# Patient Record
Sex: Female | Born: 1990 | Race: White | Hispanic: No | Marital: Married | Smoking: Never smoker
Health system: Southern US, Community
[De-identification: ages and names within clinical notes are randomized; demographics above are authoritative.]

## PROBLEM LIST (undated history)

## (undated) DIAGNOSIS — Z789 Other specified health status: Secondary | ICD-10-CM

## (undated) HISTORY — PX: OTHER SURGICAL HISTORY: SHX169

## (undated) HISTORY — DX: Other specified health status: Z78.9

---

## 2015-09-13 DIAGNOSIS — Z3009 Encounter for other general counseling and advice on contraception: Secondary | ICD-10-CM | POA: Diagnosis not present

## 2015-11-13 ENCOUNTER — Other Ambulatory Visit (HOSPITAL_COMMUNITY)
Admission: RE | Admit: 2015-11-13 | Discharge: 2015-11-13 | Disposition: A | Discharge status: Home / Self Care | Payer: 59 | Source: Ambulatory Visit | Attending: Family Medicine | Admitting: Family Medicine

## 2015-11-13 ENCOUNTER — Other Ambulatory Visit: Payer: Self-pay | Admitting: Family Medicine

## 2015-11-13 DIAGNOSIS — Z01411 Encounter for gynecological examination (general) (routine) with abnormal findings: Secondary | ICD-10-CM | POA: Diagnosis not present

## 2015-11-13 DIAGNOSIS — Z124 Encounter for screening for malignant neoplasm of cervix: Secondary | ICD-10-CM | POA: Diagnosis not present

## 2015-11-13 DIAGNOSIS — E785 Hyperlipidemia, unspecified: Secondary | ICD-10-CM | POA: Diagnosis not present

## 2015-11-13 DIAGNOSIS — Z0000 Encounter for general adult medical examination without abnormal findings: Secondary | ICD-10-CM | POA: Diagnosis not present

## 2015-11-14 LAB — CYTOLOGY - PAP

## 2016-11-19 DIAGNOSIS — Z0000 Encounter for general adult medical examination without abnormal findings: Secondary | ICD-10-CM | POA: Diagnosis not present

## 2016-11-19 DIAGNOSIS — E785 Hyperlipidemia, unspecified: Secondary | ICD-10-CM | POA: Diagnosis not present

## 2016-11-19 DIAGNOSIS — Z309 Encounter for contraceptive management, unspecified: Secondary | ICD-10-CM | POA: Diagnosis not present

## 2018-09-29 ENCOUNTER — Ambulatory Visit (INDEPENDENT_AMBULATORY_CARE_PROVIDER_SITE_OTHER): Payer: No Typology Code available for payment source | Admitting: Advanced Practice Midwife

## 2018-09-29 ENCOUNTER — Encounter: Payer: Self-pay | Admitting: Advanced Practice Midwife

## 2018-09-29 ENCOUNTER — Other Ambulatory Visit: Payer: Self-pay

## 2018-09-29 VITALS — BP 116/68 | Ht 66.0 in | Wt 129.0 lb

## 2018-09-29 DIAGNOSIS — Z3401 Encounter for supervision of normal first pregnancy, first trimester: Secondary | ICD-10-CM

## 2018-09-29 DIAGNOSIS — Z3A01 Less than 8 weeks gestation of pregnancy: Secondary | ICD-10-CM | POA: Diagnosis not present

## 2018-09-29 DIAGNOSIS — O3680X0 Pregnancy with inconclusive fetal viability, not applicable or unspecified: Secondary | ICD-10-CM | POA: Diagnosis not present

## 2018-09-29 MED ORDER — PRENATAL VITAMIN 27-0.8 MG PO TABS: 1.0000 | ORAL_TABLET | Freq: Every day | ORAL | 12 refills | Status: DC

## 2018-09-29 NOTE — Progress Notes (Signed)
  Subjective:    Caroline Mack is a G1P0 [redacted]w[redacted]d being seen today for her first obstetrical visit.  Her obstetrical history is significant for primigravida. Patient does intend to breast feed. Pregnancy history fully reviewed.  Patient reports no complaints.  Vitals:   09/29/18 0900 09/29/18 0900  BP: 116/68   Weight: 58.5 kg   Height:  5\' 6"  (1.676 m)    HISTORY: OB History  Gravida Para Term Preterm AB Living  1            SAB TAB Ectopic Multiple Live Births               # Outcome Date GA Lbr Len/2nd Weight Sex Delivery Anes PTL Lv  1 Current            Past Medical History:  Diagnosis Date  . Medical history non-contributory    Past Surgical History:  Procedure Laterality Date  . ear tumor Left    Family History  Problem Relation Age of Onset  . Diabetes Maternal Grandfather   . Cancer Paternal Grandfather   . Hypertension Neg Hx      Exam    Uterus:     Pelvic Exam:    Perineum: No Hemorrhoids   Vulva: Bartholin's, Urethra, Skene's normal   Vagina:  normal mucosa, normal discharge   pH:    Cervix: Bleeding with pap   Adnexa: normal adnexa and no mass, fullness, tenderness   Bony Pelvis: gynecoid  System: Breast:  normal appearance, no masses or tenderness   Skin: normal coloration and turgor, no rashes    Neurologic: oriented, grossly non-focal   Extremities: normal strength, tone, and muscle mass   HEENT neck supple with midline trachea   Mouth/Teeth mucous membranes moist, pharynx normal without lesions   Neck supple   Cardiovascular: regular rate and rhythm   Respiratory:  appears well, vitals normal, no respiratory distress, acyanotic, normal RR, ear and throat exam is normal, neck free of mass or lymphadenopathy   Lungs CTAB   Abdomen: soft, non-tender; bowel sounds normal; no masses,  no organomegaly   Urinary: urethral meatus normal      Assessment:    Pregnancy: G1P0 Patient Active Problem List   Diagnosis Date Noted  . Encounter for  supervision of normal first pregnancy in first trimester 09/29/2018    Single intrauterine pregnancy at [redacted]w[redacted]d     Plan:  Routines reviewed Welcomed to practice. Reviewed providers, how we work and MAU availability  Initial labs drawn. Prenatal vitamins. Problem list reviewed and updated. Genetic Screening discussed - Panorama with AFP  requested.  Ultrasound discussed; fetal survey: requested.  Follow up in 4 weeks. 50% of 30 min visit spent on counseling and coordination of care.     Hansel Feinstein 09/29/2018

## 2018-09-29 NOTE — Progress Notes (Signed)
DATING AND VIABILITY SONOGRAM   Caroline Mack is a 28 y.o. year old G1P0 with LMP Patient's last menstrual period was 08/10/2018. which would correlate to  [redacted]w[redacted]d weeks gestation.  She has irregular menstrual cycles.   She is here today for a confirmatory initial sonogram.    GESTATION: SINGLETON     FETAL ACTIVITY:          Heart rate        130          The fetus is active.     GESTATIONAL AGE AND  BIOMETRICS:  Gestational criteria: Estimated Date of Delivery: 05/17/19 by LMP now at [redacted]w[redacted]d  Previous Scans:0  GESTATIONAL SAC           2.02 cm        6 - 6 weeks  CROWN RUMP LENGTH           0.901 cm        6-6 weeks                                                                               AVERAGE EGA(BY THIS SCAN):  6-6 weeks  WORKING EDD( LMP ):  05-16-2018     TECHNICIAN COMMENTS:Patient informed that the ultrasound is considered a limited obstetric ultrasound and is not intended to be a complete ultrasound exam. Patient also informed that the ultrasound is not being completed with the intent of assessing for fetal or placental anomalies or any pelvic abnormalities. Explained that the purpose of today's ultrasound is to assess for fetal heart rate. Patient acknowledges the purpose of the exam and the limitations of the study.     Kathrene Alu 09/29/2018 9:21 AM

## 2018-09-29 NOTE — Patient Instructions (Signed)
Genetic Testing During Pregnancy Genetic testing during pregnancy is also called prenatal genetic testing. This type of testing can determine if your baby is at risk of being born with a disorder caused by abnormal genes or chromosomes (genetic disorder). Chromosomes contain genes that control how your baby will develop in your womb. There are many different genetic disorders. Examples of genetic disorders that may be found through genetic testing include Down syndrome and cystic fibrosis. Gene changes (mutations) can be passed down through families. Genetic testing is offered to all women before or during pregnancy. You can choose whether to have genetic testing. Why is genetic testing done? Genetic testing is done during pregnancy to find out whether your child is at risk for a genetic disorder. Having genetic testing allows you to:  Discuss your test results and options with a genetic counselor.  Prepare for a baby that may be born with a genetic disorder. Learning about the disorder ahead of time helps you be better prepared to manage it. Your health care providers can also be prepared in case your baby requires special care before or after birth.  Consider whether you want to continue with the pregnancy. In some cases, genetic testing may be done to learn about the traits a child will inherit. Types of genetic tests There are two basic types of genetic testing. Screening tests indicate whether your developing baby (fetus) is at higher risk for a genetic disorder. Diagnostic tests check actual fetal cells to diagnose a genetic disorder. Screening tests     Screening tests will not harm your baby. They are recommended for all pregnant women. Types of screening tests include:  Carrier screening. This test involves checking genes from both parents by testing their blood or saliva. The test checks to find out if the parents carry a genetic mutation that may be passed to a baby. In most cases,  both parents must carry the mutation for a baby to be at risk.  First trimester screening. This test combines a blood test with sound wave imaging of your baby (fetal ultrasound). This screening test checks for a risk of Down syndrome or other defects caused by having extra chromosomes. It also checks for defects of the heart, abdomen, or skeleton.  Second trimester screening also combines a blood test with a fetal ultrasound exam. It checks for a risk of genetic defects of the face, brain, spine, heart, or limbs.  Combined or sequential screening. This type of testing combines the results of first and second trimester screening. This type of testing may be more accurate than first or second trimester screening alone.  Cell-free DNA testing. This is a blood test that detects cells released by the placenta that get into the mother's blood. It can be used to check for a risk of Down syndrome, other extra chromosome syndromes, and disorders caused by abnormal numbers of sex chromosomes. This test can be done any time after 10 weeks of pregnancy.  Diagnostic tests Diagnostic tests carry slight risks of problems, including bleeding, infection, and loss of the pregnancy. These tests are done only if your baby is at risk for a genetic disorder. You may meet with a genetic counselor to discuss the risks and benefits before having diagnostic tests. Examples of diagnostic tests include:  Chorionic villus sampling (CVS). This involves a procedure to remove and test a sample of cells taken from the placenta. The procedure may be done between 10 and 12 weeks of pregnancy.  Amniocentesis. This involves a  procedure to remove and test a sample of fluid (amniotic fluid) and cells from the sac that surrounds the developing baby. The procedure may be done between 15 and 20 weeks of pregnancy. What do the results mean? For a screening test:  If the results are negative, it often means that your child is not at higher  risk. There is still a slight chance your child could have a genetic disorder.  If the results are positive, it does not mean your child will have a genetic disorder. It may mean that your child has a higher-than-normal risk for a genetic disorder. In that case, you may want to talk with a genetic counselor about whether you should have diagnostic genetic tests. For a diagnostic test:  If the result is negative, it is unlikely that your child will have a genetic disorder.  If the test is positive for a genetic disorder, it is likely that your child will have the disorder. The test may not tell how severe the disorder will be. Talk with your health care provider about your options. Questions to ask your health care provider Before talking to your health care provider about genetic testing, find out if there is a history of genetic disorders in your family. It may also help to know your family's ethnic origins. Then ask your health care provider the following questions:  Is my baby at risk for a genetic disorder?  What are the benefits of having genetic screening?  What tests are best for me and my baby?  What are the risks of each test?  If I get a positive result on a screening test, what is the next step?  Should I meet with a genetic counselor before having a diagnostic test?  Should my partner or other members of my family be tested?  How much do the tests cost? Will my insurance cover the testing? Summary  Genetic testing is done during pregnancy to find out whether your child is at risk for a genetic disorder.  Genetic testing is offered to all women before or during pregnancy. You can choose whether to have genetic testing.  There are two basic types of genetic testing. Screening tests indicate whether your developing baby (fetus) is at higher risk for a genetic disorder. Diagnostic tests check actual fetal cells to diagnose a genetic disorder.  If a diagnostic genetic test is  positive, talk with your health care provider about your options. This information is not intended to replace advice given to you by your health care provider. Make sure you discuss any questions you have with your health care provider. Document Released: 06/23/2017 Document Revised: 06/23/2017 Document Reviewed: 06/23/2017 Elsevier Interactive Patient Education  2019 [employer] First Trimester of Pregnancy The first trimester of pregnancy is from week 1 until the end of week 13 (months 1 through 3). A week after a sperm fertilizes an egg, the egg will implant on the wall of the uterus. This embryo will begin to develop into a baby. Genes from you and your partner will form the baby. The female genes will determine whether the baby will be a boy or a girl. At 6-8 weeks, the eyes and face will be formed, and the heartbeat can be seen on ultrasound. At the end of 12 weeks, all the baby's organs will be formed. Now that you are pregnant, you will want to do everything you can to have a healthy baby. Two of the most important things are to get good  prenatal care and to follow your health care provider's instructions. Prenatal care is all the medical care you receive before the baby's birth. This care will help prevent, find, and treat any problems during the pregnancy and childbirth. Body changes during your first trimester Your body goes through many changes during pregnancy. The changes vary from woman to woman.  You may gain or lose a couple of pounds at first.  You may feel sick to your stomach (nauseous) and you may throw up (vomit). If the vomiting is uncontrollable, call your health care provider.  You may tire easily.  You may develop headaches that can be relieved by medicines. All medicines should be approved by your health care provider.  You may urinate more often. Painful urination may mean you have a bladder infection.  You may develop heartburn as a result of your pregnancy.  You  may develop constipation because certain hormones are causing the muscles that push stool through your intestines to slow down.  You may develop hemorrhoids or swollen veins (varicose veins).  Your breasts may begin to grow larger and become tender. Your nipples may stick out more, and the tissue that surrounds them (areola) may become darker.  Your gums may bleed and may be sensitive to brushing and flossing.  Dark spots or blotches (chloasma, mask of pregnancy) may develop on your face. This will likely fade after the baby is born.  Your menstrual periods will stop.  You may have a loss of appetite.  You may develop cravings for certain kinds of food.  You may have changes in your emotions from day to day, such as being excited to be pregnant or being concerned that something may go wrong with the pregnancy and baby.  You may have more vivid and strange dreams.  You may have changes in your hair. These can include thickening of your hair, rapid growth, and changes in texture. Some women also have hair loss during or after pregnancy, or hair that feels dry or thin. Your hair will most likely return to normal after your baby is born. What to expect at prenatal visits During a routine prenatal visit:  You will be weighed to make sure you and the baby are growing normally.  Your blood pressure will be taken.  Your abdomen will be measured to track your baby's growth.  The fetal heartbeat will be listened to between weeks 10 and 14 of your pregnancy.  Test results from any previous visits will be discussed. Your health care provider may ask you:  How you are feeling.  If you are feeling the baby move.  If you have had any abnormal symptoms, such as leaking fluid, bleeding, severe headaches, or abdominal cramping.  If you are using any tobacco products, including cigarettes, chewing tobacco, and electronic cigarettes.  If you have any questions. Other tests that may be  performed during your first trimester include:  Blood tests to find your blood type and to check for the presence of any previous infections. The tests will also be used to check for low iron levels (anemia) and protein on red blood cells (Rh antibodies). Depending on your risk factors, or if you previously had diabetes during pregnancy, you may have tests to check for high blood sugar that affects pregnant women (gestational diabetes).  Urine tests to check for infections, diabetes, or protein in the urine.  An ultrasound to confirm the proper growth and development of the baby.  Fetal screens for spinal cord  presence of any previous infections. The tests will also be used to check for low iron levels (anemia) and protein on red blood cells (Rh antibodies). Depending on your risk factors, or if you previously had diabetes during pregnancy, you may have tests to check for high blood sugar that affects pregnant women (gestational diabetes).   Urine tests to check for infections, diabetes, or protein in the urine.   An ultrasound to confirm the proper growth and development of the baby.   Fetal screens for spinal cord problems (spina bifida) and Down syndrome.   HIV (human immunodeficiency virus) testing. Routine prenatal testing includes screening for HIV, unless you choose not to have this test.   You may need other tests to make sure you and the baby are doing well.  Follow these instructions at home:  Medicines   Follow your health care provider's instructions regarding medicine use. Specific medicines may be either safe or unsafe to take during pregnancy.   Take a prenatal vitamin that contains at least 600 micrograms (mcg) of folic acid.   If you develop constipation, try taking a stool softener if your health care provider approves.  Eating and drinking     Eat a balanced diet that includes fresh fruits and vegetables, whole grains, good sources of protein such as meat, eggs, or tofu, and low-fat dairy. Your health care provider will help you determine the amount of weight gain that is right for you.   Avoid raw meat and uncooked cheese. These carry germs that can cause birth defects in the baby.   Eating four or five small meals rather than three large meals a day may help relieve nausea and vomiting. If you start to feel nauseous, eating a few soda crackers can be helpful. Drinking liquids between meals, instead of during meals, also seems to help ease nausea and vomiting.   Limit foods that are  high in fat and processed sugars, such as fried and sweet foods.   To prevent constipation:  ? Eat foods that are high in fiber, such as fresh fruits and vegetables, whole grains, and beans.  ? Drink enough fluid to keep your urine clear or pale yellow.  Activity   Exercise only as directed by your health care provider. Most women can continue their usual exercise routine during pregnancy. Try to exercise for 30 minutes at least 5 days a week. Exercising will help you:  ? Control your weight.  ? Stay in shape.  ? Be prepared for labor and delivery.   Experiencing pain or cramping in the lower abdomen or lower back is a good sign that you should stop exercising. Check with your health care provider before continuing with normal exercises.   Try to avoid standing for long periods of time. Move your legs often if you must stand in one place for a long time.   Avoid heavy lifting.   Wear low-heeled shoes and practice good posture.   You may continue to have sex unless your health care provider tells you not to.  Relieving pain and discomfort   Wear a good support bra to relieve breast tenderness.   Take warm sitz baths to soothe any pain or discomfort caused by hemorrhoids. Use hemorrhoid cream if your health care provider approves.   Rest with your legs elevated if you have leg cramps or low back pain.   If you develop varicose veins in your legs, wear support hose. Elevate your feet for 15 minutes, 3-4 times   month 6 of your pregnancy.  Ask for help if you have counseling or nutritional needs during pregnancy. Your health care provider can offer advice or refer you to specialists for help with various needs.  Do not use hot tubs, steam rooms, or saunas.  Do not douche or use tampons or scented sanitary pads.  Do not cross your legs for long periods of time.  Avoid cat litter boxes and soil used by cats. These carry germs that can cause birth defects in the baby and possibly loss of the fetus by miscarriage or stillbirth.  Avoid all smoking, herbs, alcohol, and medicines not prescribed by your health care provider. Chemicals in these products affect the formation and growth of the baby.  Do not use any products that contain nicotine or tobacco, such as cigarettes and e-cigarettes. If you need help quitting, ask your health care provider. You may receive counseling support and other resources to help you quit.  Schedule a dentist appointment. At home, brush your teeth with a soft toothbrush and be gentle when you floss. Contact a health care provider if:  You have dizziness.  You have mild pelvic cramps, pelvic pressure, or nagging pain in the abdominal area.  You have persistent nausea, vomiting, or diarrhea.  You have a bad smelling vaginal discharge.  You have pain when you urinate.  You notice increased swelling in your face, hands, legs, or ankles.  You are exposed to fifth disease or chickenpox.  You are exposed to [place] measles (rubella) and have never had it. Get help right away if:  You have a fever.  You are leaking fluid from your vagina.  You have spotting or bleeding from your vagina.  You have severe abdominal cramping or pain.  You have rapid weight gain or loss.   You vomit blood or material that looks like coffee grounds.  You develop a severe headache.  You have shortness of breath.  You have any kind of trauma, such as from a fall or a car accident. Summary  The first trimester of pregnancy is from week 1 until the end of week 13 (months 1 through 3).  Your body goes through many changes during pregnancy. The changes vary from woman to woman.  You will have routine prenatal visits. During those visits, your health care provider will examine you, discuss any test results you may have, and talk with you about how you are feeling. This information is not intended to replace advice given to you by your health care provider. Make sure you discuss any questions you have with your health care provider. Document Released: 04/02/2001 Document Revised: 03/20/2016 Document Reviewed: 03/20/2016 Elsevier Interactive Patient Education  2019 [employer].

## 2018-09-30 LAB — OBSTETRIC PANEL, INCLUDING HIV
Antibody Screen: NEGATIVE
Basophils Absolute: 0 10*3/uL (ref 0.0–0.2)
Basos: 0 %
EOS (ABSOLUTE): 0 10*3/uL (ref 0.0–0.4)
Eos: 1 %
HIV Screen 4th Generation wRfx: NONREACTIVE
Hematocrit: 36.3 % (ref 34.0–46.6)
Hemoglobin: 12.2 g/dL (ref 11.1–15.9)
Hepatitis B Surface Ag: NEGATIVE
Immature Grans (Abs): 0 10*3/uL (ref 0.0–0.1)
Immature Granulocytes: 0 %
Lymphocytes Absolute: 1.8 10*3/uL (ref 0.7–3.1)
Lymphs: 23 %
MCH: 30.3 pg (ref 26.6–33.0)
MCHC: 33.6 g/dL (ref 31.5–35.7)
MCV: 90 fL (ref 79–97)
Monocytes Absolute: 0.8 10*3/uL (ref 0.1–0.9)
Monocytes: 10 %
Neutrophils Absolute: 5.2 10*3/uL (ref 1.4–7.0)
Neutrophils: 66 %
Platelets: 214 10*3/uL (ref 150–450)
RBC: 4.03 x10E6/uL (ref 3.77–5.28)
RDW: 13.4 % (ref 11.7–15.4)
RPR Ser Ql: NONREACTIVE
Rh Factor: POSITIVE
Rubella Antibodies, IGG: 1.82 index (ref >0.99)
WBC: 7.8 10*3/uL (ref 3.4–10.8)

## 2018-10-01 LAB — CYTOLOGY - PAP
Chlamydia: NEGATIVE
Diagnosis: NEGATIVE
Neisseria Gonorrhea: NEGATIVE

## 2018-10-01 LAB — URINE CULTURE: Organism ID, Bacteria: NO GROWTH

## 2018-10-02 ENCOUNTER — Encounter: Payer: Self-pay | Admitting: Advanced Practice Midwife

## 2018-10-02 DIAGNOSIS — Z01419 Encounter for gynecological examination (general) (routine) without abnormal findings: Secondary | ICD-10-CM | POA: Insufficient documentation

## 2018-10-27 ENCOUNTER — Encounter: Payer: Self-pay | Admitting: Advanced Practice Midwife

## 2018-10-27 ENCOUNTER — Other Ambulatory Visit: Payer: Self-pay

## 2018-10-27 ENCOUNTER — Ambulatory Visit (INDEPENDENT_AMBULATORY_CARE_PROVIDER_SITE_OTHER): Payer: No Typology Code available for payment source | Admitting: Advanced Practice Midwife

## 2018-10-27 VITALS — BP 116/69 | Wt 128.0 lb

## 2018-10-27 DIAGNOSIS — Z3401 Encounter for supervision of normal first pregnancy, first trimester: Secondary | ICD-10-CM

## 2018-10-27 DIAGNOSIS — Z3400 Encounter for supervision of normal first pregnancy, unspecified trimester: Secondary | ICD-10-CM

## 2018-10-27 DIAGNOSIS — Z3A11 11 weeks gestation of pregnancy: Secondary | ICD-10-CM

## 2018-10-27 MED ORDER — AMBULATORY NON FORMULARY MEDICATION: 1.0000 | 0 refills | Status: DC

## 2018-10-27 NOTE — Progress Notes (Deleted)
  Subjective:    Caroline Mack is being seen today for her first obstetrical visit.  This {is/is not:9024} a planned pregnancy. She is at [redacted]w[redacted]d gestation. Her obstetrical history is significant for {ob risk factors:10154}. Relationship with FOB: {fob:16621}. Patient {does/does not:19097} intend to breast feed. Pregnancy history fully reviewed.  Patient reports {sx:14538}.  Review of Systems:   Review of Systems  Objective:     BP 116/69   Wt 58.1 kg   LMP 08/10/2018   BMI 20.66 kg/m  Physical Exam  Exam    Assessment:    Pregnancy: G1P0 Patient Active Problem List   Diagnosis Date Noted  . Pap smear, low-risk 10/02/2018  . Encounter for supervision of normal first pregnancy in first trimester 09/29/2018       Plan:     Initial labs drawn. Prenatal vitamins. Problem list reviewed and updated. AFP3 discussed: {requests/ordered/declines:14581}. Role of ultrasound in pregnancy discussed; fetal survey: {requests/ordered/declines:14581}. Amniocentesis discussed: {amniocentesis:14582}. Follow up in {numbers 0-4:31231} weeks. ***% of *** min visit spent on counseling and coordination of care.  ***   Jong Rickman 10/27/2018   

## 2018-10-27 NOTE — Progress Notes (Deleted)
  Subjective:    Caroline Mack is being seen today for her first obstetrical visit.  This {is/is not:9024} a planned pregnancy. She is at [redacted]w[redacted]d gestation. Her obstetrical history is significant for {ob risk factors:10154}. Relationship with FOB: {fob:16621}. Patient {does/does not:19097} intend to breast feed. Pregnancy history fully reviewed.  Patient reports {sx:14538}.  Review of Systems:   Review of Systems  Objective:     BP 116/69   Wt 58.1 kg   LMP 08/10/2018   BMI 20.66 kg/m  Physical Exam  Exam    Assessment:    Pregnancy: G1P0 Patient Active Problem List   Diagnosis Date Noted  . Pap smear, low-risk 10/02/2018  . Encounter for supervision of normal first pregnancy in first trimester 09/29/2018       Plan:     Initial labs drawn. Prenatal vitamins. Problem list reviewed and updated. AFP3 discussed: {requests/ordered/declines:14581}. Role of ultrasound in pregnancy discussed; fetal survey: {requests/ordered/declines:14581}. Amniocentesis discussed: {amniocentesis:14582}. Follow up in {numbers 0-4:31231} weeks. ***% of *** min visit spent on counseling and coordination of care.  ***   Hansel Feinstein 10/27/2018

## 2018-10-27 NOTE — Progress Notes (Signed)
   PRENATAL VISIT NOTE  Subjective:  Caroline Mack is a 28 y.o. G1P0 at 28w1dbeing seen today for ongoing prenatal care.  She is currently monitored for the following issues for this low-risk pregnancy and has Encounter for supervision of normal first pregnancy in first trimester and Pap smear, low-risk on their problem list.  Patient reports no complaints.   .  .  Movement: Absent. Denies leaking of fluid.   The following portions of the patient's history were reviewed and updated as appropriate: allergies, current medications, past family history, past medical history, past social history, past surgical history and problem list.   Objective:   Vitals:   10/27/18 0852  BP: 116/69  Weight: 58.1 kg    Fetal Status: Fetal Heart Rate (bpm): 165 Fundal Height: 11 cm Movement: Absent     General:  Alert, oriented and cooperative. Patient is in no acute distress.  Skin: Skin is warm and dry. No rash noted.   Cardiovascular: Normal heart rate noted  Respiratory: Normal respiratory effort, no problems with respiration noted  Abdomen: Soft, gravid, appropriate for gestational age.  Pain/Pressure: Absent     Pelvic: Cervical exam deferred        Extremities: Normal range of motion.  Edema: None  Mental Status: Normal mood and affect. Normal behavior. Normal judgment and thought content.   Assessment and Plan:  Pregnancy: G1P0 at 130w1d. Supervision of normal first pregnancy, antepartum      Reviewed signs of problems to report and how to go to MAU      Reviewed fetal development      Declines genetic testiing for now due to cost (states insurance won't pay) I suggested she call them directly and ask as most do pay)  - AMBULATORY NON FORMULARY MEDICATION; 1 Device by Other route once a week. Blood pressure Cuff/Medium  Monitored Regularly at home ICD 10: Z34.90  Dispense: 1 kit; Refill: 0  Preterm labor symptoms and general obstetric precautions including but not limited to vaginal  bleeding, contractions, leaking of fluid and fetal movement were reviewed in detail with the patient. Please refer to After Visit Summary for other counseling recommendations.   Return in about 4 weeks (around 11/24/2018) for [employer] Future Appointments  Date Time Provider [place]8/10/2018  8:45 AM HaLavonia DraftsMD [facility] None  12/22/2018  8:00 AM WHGodleySKorea [facility]-MFCUS MFC-US    MaHansel FeinsteinCNM

## 2018-10-27 NOTE — Patient Instructions (Signed)

## 2018-11-06 ENCOUNTER — Encounter: Payer: 59 | Admitting: Obstetrics & Gynecology

## 2018-11-06 ENCOUNTER — Encounter: Payer: Self-pay | Admitting: Obstetrics & Gynecology

## 2018-11-27 ENCOUNTER — Ambulatory Visit (INDEPENDENT_AMBULATORY_CARE_PROVIDER_SITE_OTHER): Payer: No Typology Code available for payment source | Admitting: Obstetrics & Gynecology

## 2018-11-27 ENCOUNTER — Other Ambulatory Visit: Payer: Self-pay

## 2018-11-27 ENCOUNTER — Encounter: Payer: Self-pay | Admitting: Obstetrics & Gynecology

## 2018-11-27 VITALS — BP 113/72 | HR 93 | Wt 129.0 lb

## 2018-11-27 DIAGNOSIS — Z3A15 15 weeks gestation of pregnancy: Secondary | ICD-10-CM

## 2018-11-27 DIAGNOSIS — Z3401 Encounter for supervision of normal first pregnancy, first trimester: Secondary | ICD-10-CM

## 2018-11-27 DIAGNOSIS — Z3400 Encounter for supervision of normal first pregnancy, unspecified trimester: Secondary | ICD-10-CM

## 2018-11-27 NOTE — Patient Instructions (Addendum)

## 2018-11-27 NOTE — Progress Notes (Signed)
   PRENATAL VISIT NOTE  Subjective:  Caroline Mack is a 28 y.o. G1P0 at [redacted]w[redacted]d being seen today for ongoing prenatal care.  She is currently monitored for the following issues for this low-risk pregnancy and has Encounter for supervision of normal first pregnancy in first trimester and Pap smear, low-risk on their problem list.  Patient reports no complaints.  Contractions: Not present. Vag. Bleeding: None.   . Denies leaking of fluid.   The following portions of the patient's history were reviewed and updated as appropriate: allergies, current medications, past family history, past medical history, past social history, past surgical history and problem list.   Objective:   Vitals:   11/27/18 0900  BP: 113/72  Pulse: 93  Weight: 129 lb (58.5 kg)    Fetal Status: Fetal Heart Rate (bpm): 155         General:  Alert, oriented and cooperative. Patient is in no acute distress.  Skin: Skin is warm and dry. No rash noted.   Cardiovascular: Normal heart rate noted  Respiratory: Normal respiratory effort, no problems with respiration noted  Abdomen: Soft, gravid, appropriate for gestational age.  Pain/Pressure: Absent     Pelvic: Cervical exam deferred        Extremities: Normal range of motion.  Edema: None  Mental Status: Normal mood and affect. Normal behavior. Normal judgment and thought content.   Assessment and Plan:  Pregnancy: G1P0 at [redacted]w[redacted]d 1. Supervision of normal first pregnancy, antepartum Pt wants to call the company to see if the NIPS is covered by her insurance.   2. Encounter for supervision of normal first pregnancy in first trimester Has [place] scheduled.   Preterm labor symptoms and general obstetric precautions including but not limited to vaginal bleeding, contractions, leaking of fluid and fetal movement were reviewed in detail with the patient. Please refer to After Visit Summary for other counseling recommendations.   Return in about 5 weeks (around 01/01/2019).   Future Appointments  Date Time Provider [place]  12/22/2018  8:00 AM [facility]-MFC [place] 3 [facility]-M[facility]US M[facility]-US    Lavonia Drafts, MD

## 2018-12-22 ENCOUNTER — Other Ambulatory Visit: Payer: Self-pay

## 2018-12-22 ENCOUNTER — Ambulatory Visit (HOSPITAL_COMMUNITY)
Admission: RE | Admit: 2018-12-22 | Discharge: 2018-12-22 | Disposition: A | Discharge status: Home / Self Care | Payer: No Typology Code available for payment source | Source: Ambulatory Visit | Attending: Obstetrics and Gynecology | Admitting: Obstetrics and Gynecology

## 2018-12-22 DIAGNOSIS — Z363 Encounter for antenatal screening for malformations: Secondary | ICD-10-CM

## 2018-12-22 DIAGNOSIS — Z3401 Encounter for supervision of normal first pregnancy, first trimester: Secondary | ICD-10-CM | POA: Insufficient documentation

## 2019-01-01 ENCOUNTER — Ambulatory Visit (INDEPENDENT_AMBULATORY_CARE_PROVIDER_SITE_OTHER): Payer: No Typology Code available for payment source | Admitting: Family Medicine

## 2019-01-01 ENCOUNTER — Other Ambulatory Visit: Payer: Self-pay

## 2019-01-01 VITALS — BP 117/85 | HR 90 | Wt 135.0 lb

## 2019-01-01 DIAGNOSIS — Z3400 Encounter for supervision of normal first pregnancy, unspecified trimester: Secondary | ICD-10-CM

## 2019-01-01 DIAGNOSIS — Z3A20 20 weeks gestation of pregnancy: Secondary | ICD-10-CM

## 2019-01-01 DIAGNOSIS — Z3401 Encounter for supervision of normal first pregnancy, first trimester: Secondary | ICD-10-CM

## 2019-01-01 NOTE — Progress Notes (Signed)
   PRENATAL VISIT NOTE  Subjective:  Caroline Mack is a 28 y.o. G1P0 at [redacted]w[redacted]d being seen today for ongoing prenatal care.  She is currently monitored for the following issues for this low-risk pregnancy and has Encounter for supervision of normal first pregnancy in first trimester and Pap smear, low-risk on their problem list.  Patient reports no complaints.  Contractions: Not present. Vag. Bleeding: None.  Movement: Present. Denies leaking of fluid.   The following portions of the patient's history were reviewed and updated as appropriate: allergies, current medications, past family history, past medical history, past social history, past surgical history and problem list.   Objective:   Vitals:   01/01/19 0906  BP: 117/85  Pulse: 90  Weight: 135 lb (61.2 kg)    Fetal Status: Fetal Heart Rate (bpm): 154 Fundal Height: 20 cm Movement: Present     General:  Alert, oriented and cooperative. Patient is in no acute distress.  Skin: Skin is warm and dry. No rash noted.   Cardiovascular: Normal heart rate noted  Respiratory: Normal respiratory effort, no problems with respiration noted  Abdomen: Soft, gravid, appropriate for gestational age.  Pain/Pressure: Absent     Pelvic: Cervical exam deferred        Extremities: Normal range of motion.  Edema: None  Mental Status: Normal mood and affect. Normal behavior. Normal judgment and thought content.   Assessment and Plan:  Pregnancy: G1P0 at [redacted]w[redacted]d 1. Supervision of normal first pregnancy, antepartum FHT and FH normal. [place] - no concerns.   Preterm labor symptoms and general obstetric precautions including but not limited to vaginal bleeding, contractions, leaking of fluid and fetal movement were reviewed in detail with the patient. Please refer to After Visit Summary for other counseling recommendations.   Return in about 4 weeks (around 01/29/2019) for OB f/u, Virtual.  No future appointments.  Truett Mainland, DO

## 2019-02-02 ENCOUNTER — Ambulatory Visit (INDEPENDENT_AMBULATORY_CARE_PROVIDER_SITE_OTHER): Payer: No Typology Code available for payment source | Admitting: Advanced Practice Midwife

## 2019-02-02 ENCOUNTER — Encounter: Payer: Self-pay | Admitting: Advanced Practice Midwife

## 2019-02-02 VITALS — BP 112/70 | HR 80 | Wt 138.5 lb

## 2019-02-02 DIAGNOSIS — Z3400 Encounter for supervision of normal first pregnancy, unspecified trimester: Secondary | ICD-10-CM

## 2019-02-02 DIAGNOSIS — Z3A25 25 weeks gestation of pregnancy: Secondary | ICD-10-CM

## 2019-02-02 DIAGNOSIS — Z3402 Encounter for supervision of normal first pregnancy, second trimester: Secondary | ICD-10-CM

## 2019-02-02 NOTE — Progress Notes (Signed)
I connected on 02/02/19 at  9:00 AM EDT by: webex and verified that I am speaking with the correct person using two identifiers.  Patient is located at home and provider is located at [facility] office.     The purpose of this virtual visit is to provide medical care while limiting exposure to the novel coronavirus. I discussed the limitations, risks, security and privacy concerns of performing an evaluation and management service by myself and the availability of in person appointments. I also discussed with the patient that there may be a patient responsible charge related to this service. By engaging in this virtual visit, you consent to the provision of healthcare.  Additionally, you authorize for your insurance to be billed for the services provided during this visit.  The patient expressed understanding and agreed to proceed.  The following staff members participated in the virtual visit:  Wendelyn Breslow    PRENATAL VISIT NOTE  Subjective:  Caroline Mack is a 28 y.o. G1P0 at [redacted]w[redacted]d  for phone visit for ongoing prenatal care.  She is currently monitored for the following issues for this low-risk pregnancy and has Encounter for supervision of normal first pregnancy in first trimester and Pap smear, low-risk on their problem list.  Patient reports no complaints.  Contractions: Not present. Vag. Bleeding: None.  Movement: Present. Denies leaking of fluid.   The following portions of the patient's history were reviewed and updated as appropriate: allergies, current medications, past family history, past medical history, past social history, past surgical history and problem list.   Objective:   Vitals:   02/02/19 0921  BP: 112/70  Pulse: 80  Weight: 62.8 kg   Self-Obtained  Fetal Status:     Movement: Present     Assessment and Plan:  Pregnancy: G1P0 at [redacted]w[redacted]d Doing well BPs within normal limits. Takes them at work [employer] moving well Got flu shot in September Discussed TDAP Anatomy US WNL,  having a girl Plan Glucola 2-3 weeks Encouraged to go to MAU if she has a fall, pain, fever, or other concerning symptoms.    Preterm labor symptoms and general obstetric precautions including but not limited to vaginal bleeding, contractions, leaking of fluid and fetal movement were reviewed in detail with the patient.  No follow-ups on file.  Future Appointments  Date Time Provider [place]  02/26/2019  8:15 AM Truett Mainland, DO [facility] None     Time spent on virtual visit: 9 minutes  Hansel Feinstein, CNM

## 2019-02-02 NOTE — Patient Instructions (Signed)

## 2019-02-02 NOTE — Progress Notes (Signed)
error 

## 2019-02-26 ENCOUNTER — Encounter: Payer: Self-pay | Admitting: Family Medicine

## 2019-02-26 ENCOUNTER — Ambulatory Visit (INDEPENDENT_AMBULATORY_CARE_PROVIDER_SITE_OTHER): Payer: No Typology Code available for payment source | Admitting: Family Medicine

## 2019-02-26 ENCOUNTER — Other Ambulatory Visit: Payer: Self-pay

## 2019-02-26 VITALS — BP 122/76 | HR 75 | Wt 142.0 lb

## 2019-02-26 DIAGNOSIS — Z23 Encounter for immunization: Secondary | ICD-10-CM | POA: Diagnosis not present

## 2019-02-26 DIAGNOSIS — Z3A28 28 weeks gestation of pregnancy: Secondary | ICD-10-CM

## 2019-02-26 DIAGNOSIS — Z3400 Encounter for supervision of normal first pregnancy, unspecified trimester: Secondary | ICD-10-CM

## 2019-02-26 DIAGNOSIS — Z3403 Encounter for supervision of normal first pregnancy, third trimester: Secondary | ICD-10-CM

## 2019-02-26 DIAGNOSIS — Z3401 Encounter for supervision of normal first pregnancy, first trimester: Secondary | ICD-10-CM

## 2019-02-26 NOTE — Progress Notes (Signed)
   PRENATAL VISIT NOTE  Subjective:  Caroline Mack is a 28 y.o. G1P0 at [redacted]w[redacted]d being seen today for ongoing prenatal care.  She is currently monitored for the following issues for this low-risk pregnancy and has Encounter for supervision of normal first pregnancy in first trimester and Pap smear, low-risk on their problem list.  Patient reports had episode of lightheadedness at work. Improved quickly after laying down..  Contractions: Not present. Vag. Bleeding: None.  Movement: Present. Denies leaking of fluid.   The following portions of the patient's history were reviewed and updated as appropriate: allergies, current medications, past family history, past medical history, past social history, past surgical history and problem list.   Objective:   Vitals:   02/26/19 0822  BP: 122/76  Pulse: 75  Weight: 142 lb (64.4 kg)    Fetal Status: Fetal Heart Rate (bpm): 148 Fundal Height: 29 cm Movement: Present     General:  Alert, oriented and cooperative. Patient is in no acute distress.  Skin: Skin is warm and dry. No rash noted.   Cardiovascular: Normal heart rate noted  Respiratory: Normal respiratory effort, no problems with respiration noted  Abdomen: Soft, gravid, appropriate for gestational age.  Pain/Pressure: Absent     Pelvic: Cervical exam deferred        Extremities: Normal range of motion.  Edema: None  Mental Status: Normal mood and affect. Normal behavior. Normal judgment and thought content.   Assessment and Plan:  Pregnancy: G1P0 at [redacted]w[redacted]d 1. Supervision of normal first pregnancy, antepartum FHT and FH normal  Preterm labor symptoms and general obstetric precautions including but not limited to vaginal bleeding, contractions, leaking of fluid and fetal movement were reviewed in detail with the patient. Please refer to After Visit Summary for other counseling recommendations.   Return in about 2 weeks (around 03/12/2019) for OB f/u.  No future appointments.  Truett Mainland, DO

## 2019-02-26 NOTE — Addendum Note (Signed)
Addended by: Phill Myron on: 02/26/2019 08:48 AM   Modules accepted: Orders

## 2019-02-27 LAB — GLUCOSE TOLERANCE, 2 HOURS W/ 1HR
Glucose, 1 hour: 176 mg/dL (ref 65–179)
Glucose, 2 hour: 140 mg/dL (ref 65–152)
Glucose, Fasting: 68 mg/dL (ref 65–91)

## 2019-02-27 LAB — CBC
Hematocrit: 35.5 % (ref 34.0–46.6)
Hemoglobin: 12.1 g/dL (ref 11.1–15.9)
MCH: 32.2 pg (ref 26.6–33.0)
MCHC: 34.1 g/dL (ref 31.5–35.7)
MCV: 94 fL (ref 79–97)
Platelets: 221 10*3/uL (ref 150–450)
RBC: 3.76 x10E6/uL — ABNORMAL LOW (ref 3.77–5.28)
RDW: 12.2 % (ref 11.7–15.4)
WBC: 9.1 10*3/uL (ref 3.4–10.8)

## 2019-02-27 LAB — RPR: RPR Ser Ql: NONREACTIVE

## 2019-02-27 LAB — HIV ANTIBODY (ROUTINE TESTING W REFLEX): HIV Screen 4th Generation wRfx: NONREACTIVE

## 2019-03-22 NOTE — Progress Notes (Signed)
Subjective:  Caroline Mack is a 28 y.o. G1P0 at [redacted]w[redacted]d being seen today for ongoing prenatal care.  She is currently monitored for the following issues for this low-risk pregnancy and has Encounter for supervision of normal first pregnancy in first trimester and Pap smear, low-risk on their problem list.  Patient reports no complaints.  Contractions: Not present. Vag. Bleeding: None.  Movement: Present. Denies leaking of fluid.   The following portions of the patient's history were reviewed and updated as appropriate: allergies, current medications, past family history, past medical history, past social history, past surgical history and problem list. Problem list updated.  Objective:   Vitals:   03/23/19 0904  BP: 116/66  Pulse: (!) 103  Weight: 142 lb (64.4 kg)    Fetal Status: Fetal Heart Rate (bpm): 140 Fundal Height: 32 cm Movement: Present  Presentation: Vertex  General:  Alert, oriented and cooperative. Patient is in no acute distress.  Skin: Skin is warm and dry. No rash noted.   Cardiovascular: Normal heart rate noted  Respiratory: Normal respiratory effort, no problems with respiration noted  Abdomen: Soft, gravid, appropriate for gestational age. Pain/Pressure: Absent     Pelvic: Vag. Bleeding: None     Cervical exam deferred        Extremities: Normal range of motion.  Edema: None  Mental Status: Normal mood and affect. Normal behavior. Normal judgment and thought content.   Assessment and Plan:  Pregnancy: G1P0 at [redacted]w[redacted]d  1. Encounter for supervision of normal first pregnancy in first trimester - discussed contraception, pt elects OCPs - peds list given  Preterm labor symptoms and general obstetric precautions including but not limited to vaginal bleeding, contractions, leaking of fluid and fetal movement were reviewed in detail with the patient. Please refer to After Visit Summary for other counseling recommendations.  Return in about 2 weeks (around 04/06/2019) for  ROB.   Nugent, Gerrie Nordmann, NP

## 2019-03-23 ENCOUNTER — Ambulatory Visit (INDEPENDENT_AMBULATORY_CARE_PROVIDER_SITE_OTHER): Payer: No Typology Code available for payment source | Admitting: Women's Health

## 2019-03-23 ENCOUNTER — Other Ambulatory Visit: Payer: Self-pay

## 2019-03-23 VITALS — BP 116/66 | HR 103 | Wt 142.0 lb

## 2019-03-23 DIAGNOSIS — Z3A32 32 weeks gestation of pregnancy: Secondary | ICD-10-CM

## 2019-03-23 DIAGNOSIS — Z3401 Encounter for supervision of normal first pregnancy, first trimester: Secondary | ICD-10-CM

## 2019-03-23 DIAGNOSIS — Z3403 Encounter for supervision of normal first pregnancy, third trimester: Secondary | ICD-10-CM

## 2019-03-23 NOTE — Patient Instructions (Addendum)
The Maternity Assessment Unit (MAU) is located at the [facility] and Children[facility] Center at [facility]. The address is: [address] [place], [place], [place] [address]. Please see map below for additional directions.    The Maternity Assessment Unit is designed to help you during your pregnancy, and for up to 6 weeks after delivery, with any pregnancy- or postpartum-related emergencies, if you think you are in labor, or if your water has broken. For example, if you experience nausea and vomiting, vaginal bleeding, severe abdominal or pelvic pain, elevated blood pressure or other problems related to your pregnancy or postpartum time, please come to the Maternity Assessment Unit for assistance.   AREA PEDIATRIC/FAMILY PRACTICE PHYSICIANS  [facility] [address] [place], [place] [address] Phone - [phone]   Fax - [phone]  JACK AMOS [address] [place], [place]  [address] Phone - [phone]   Fax - [phone]  [facility] [address] [place], [place]  [address] Phone - [phone]   Fax - [phone]  [facility] [address] [place], [place]  [address] Phone - [phone]   Fax - [phone]  [facility] [address] [place], [place]  [address] Phone - [phone]   Fax - [phone]  [facility] [address] [place], [place]  [address] Phone - [phone]   Fax - [phone]  [facility] [address] [place], [place]  [address] Phone - [phone]   Fax - [phone]  [facility] AT [facility] [address] [place], [address] [place], [place]  [address] Phone - [phone]   Fax - [phone]  [facility] AT [facility] [address] [place], [place]  [address] Phone - [phone]   Fax - [phone] [facility] [place] [address] [place], [place]  [address] Phone - [phone]   Fax - [phone]  [facility] [address] [place], [place]  [address] Phone - [phone]   Fax - [phone]  [facility] [address] [place], [place]  [address] Phone - [phone]   Fax - [phone]  [facility] [place] [address] [place], [place]  [address] Phone - [phone]   Fax - [phone]  [facility] [address] [place], [place]  [address] Phone - [phone]  [facility] [address] [place], [place]  [address] Phone - [phone]   Fax - [phone]  [facility] [address] [place], [place]  [address] Phone - [phone]   Fax - [phone]  [facility] [address] [place], [place]  [address] Phone - [phone]   Fax - [phone]  [facility] [address] [place], [place]  [address] Phone - [phone]   Fax - [phone]   [facility] [address] [place], [place]  [address] Phone - [phone]   Fax - [phone]  [facility] [address] [place], [place]  [address] Phone - [phone]   Fax - [phone]  DAVID RUBIN [address] [place], [place]  [address] Phone - [phone]   Fax - [phone]  [facility] [address] [place], [place]  [address] Phone - [phone]   Fax - [phone]  Lacey Jensen  [address] [place], [place]  [address] Phone - [phone]   Fax - [phone] Gerarda Fraction [phone] [address] [place], [place]  [address] Phone - [phone]   Fax - [phone]  [facility] [address] [place], [place]  [address] Phone - [phone]   Fax - [phone]  [facility] [place] [address] [place], [place]  [address] Phone - [phone]   Fax - [phone]  The Maternity Assessment Unit (MAU) is located at the [facility] and [facility] Center at [facility]. The address is: [address], [place], [place], [place] [address]. Please see map below for additional directions.    The Maternity Assessment Unit is designed to help you during your pregnancy, and for up to 6 weeks after delivery, with any pregnancy- or postpartum-related emergencies, if you think you are in labor, or if your water has broken. For example, if you experience nausea and vomiting, vaginal bleeding, severe abdominal or pelvic pain, elevated blood pressure or other problems related to your pregnancy or postpartum time, please come to the Maternity Assessment Unit for assistance.   Contraception Choices - [email or web address] Contraception, also called birth control, refers to methods or devices that prevent pregnancy. Hormonal methods Contraceptive implant  A contraceptive implant is a thin, plastic tube that contains a hormone. It is inserted into the upper part of the arm. It can remain in place for up to 3 years. Progestin-only injections Progestin-only injections are injections of progestin, a synthetic form of the hormone progesterone. They are given every 3 months by a health care provider. Birth control pills  Birth control pills are pills that contain hormones that prevent pregnancy. They must be taken once a day, preferably at the same time each day. Birth control patch  The birth control patch contains hormones that prevent pregnancy. It is placed on the skin and must be changed once a week for three weeks and removed on the fourth week. A prescription is needed to use this method of contraception. Vaginal ring  A vaginal ring contains hormones that prevent pregnancy. It is placed in the vagina for three weeks and removed on the fourth week. After that, the  process is repeated with a new ring. A prescription is needed to use this method of contraception. Emergency contraceptive Emergency contraceptives prevent pregnancy after unprotected sex. They come in pill form and can be taken up to 5 days after sex. They work best the sooner they are taken after having sex. Most emergency contraceptives are available without a prescription. This method should not be used as your only form of birth control. Barrier methods Female condom  A female condom is a thin sheath that is worn over the penis during sex. Condoms keep sperm from going inside a woman's body. They can be used with a spermicide to increase their effectiveness. They should be disposed after a single use. Female condom  A female condom is a soft, loose-fitting sheath that is put into the vagina before sex. The condom keeps sperm from going inside a woman's body. They should be disposed after a single use. Diaphragm  A diaphragm is a soft, dome-shaped barrier. It is inserted into the vagina before sex, along with a spermicide. The diaphragm blocks sperm from entering the uterus, and the spermicide kills sperm. A diaphragm should be left in the vagina for 6-8 hours after sex and removed within 24 hours. A diaphragm is prescribed and  fitted by a health care provider. A diaphragm should be replaced every 1-2 years, after giving birth, after gaining more than 15 lb (6.8 kg), and after pelvic surgery. Cervical cap  A cervical cap is a round, soft latex or plastic cup that fits over the cervix. It is inserted into the vagina before sex, along with spermicide. It blocks sperm from entering the uterus. The cap should be left in place for 6-8 hours after sex and removed within 48 hours. A cervical cap must be prescribed and fitted by a health care provider. It should be replaced every 2 years. Sponge  A sponge is a soft, circular piece of polyurethane foam with spermicide on it. The sponge helps block sperm  from entering the uterus, and the spermicide kills sperm. To use it, you make it wet and then insert it into the vagina. It should be inserted before sex, left in for at least 6 hours after sex, and removed and thrown away within 30 hours. Spermicides Spermicides are chemicals that kill or block sperm from entering the cervix and uterus. They can come as a cream, jelly, suppository, foam, or tablet. A spermicide should be inserted into the vagina with an applicator at least 10-15 minutes before sex to allow time for it to work. The process must be repeated every time you have sex. Spermicides do not require a prescription. Intrauterine contraception Intrauterine device (IUD) An IUD is a T-shaped device that is put in a woman's uterus. There are two types:  Hormone IUD.This type contains progestin, a synthetic form of the hormone progesterone. This type can stay in place for 3-5 years.  Copper IUD.This type is wrapped in copper wire. It can stay in place for 10 years.  Permanent methods of contraception Female tubal ligation In this method, a woman's fallopian tubes are sealed, tied, or blocked during surgery to prevent eggs from traveling to the uterus. Hysteroscopic sterilization In this method, a small, flexible insert is placed into each fallopian tube. The inserts cause scar tissue to form in the fallopian tubes and block them, so sperm cannot reach an egg. The procedure takes about 3 months to be effective. Another form of birth control must be used during those 3 months. Female sterilization This is a procedure to tie off the tubes that carry sperm (vasectomy). After the procedure, the man can still ejaculate fluid (semen). Natural planning methods Natural family planning In this method, a couple does not have sex on days when the woman could become pregnant. Calendar method This means keeping track of the length of each menstrual cycle, identifying the days when pregnancy can happen, and  not having sex on those days. Ovulation method In this method, a couple avoids sex during ovulation. Symptothermal method This method involves not having sex during ovulation. The woman typically checks for ovulation by watching changes in her temperature and in the consistency of cervical mucus. Post-ovulation method In this method, a couple waits to have sex until after ovulation. Summary  Contraception, also called birth control, means methods or devices that prevent pregnancy.  Hormonal methods of contraception include implants, injections, pills, patches, vaginal rings, and emergency contraceptives.  Barrier methods of contraception can include female condoms, female condoms, diaphragms, cervical caps, sponges, and spermicides.  There are two types of IUDs (intrauterine devices). An IUD can be put in a woman's uterus to prevent pregnancy for 3-5 years.  Permanent sterilization can be done through a procedure for males, females, or both.  Natural  family planning methods involve not having sex on days when the woman could become pregnant. This information is not intended to replace advice given to you by your health care provider. Make sure you discuss any questions you have with your health care provider. Document Released: 04/08/2005 Document Revised: 04/10/2017 Document Reviewed: 05/11/2016 Elsevier Patient Education  2020 [employer].  Preterm Labor and Birth Information  The normal length of a pregnancy is 39-41 weeks. Preterm labor is when labor starts before 37 completed weeks of pregnancy. What are the risk factors for preterm labor? Preterm labor is more likely to occur in women who:  Have certain infections during pregnancy such as a bladder infection, sexually transmitted infection, or infection inside the uterus (chorioamnionitis).  Have a shorter-than-normal cervix.  Have gone into preterm labor before.  Have had surgery on their cervix.  Are younger than age 71  or older than age 72.  Are African American.  Are pregnant with twins or multiple babies (multiple gestation).  Take street drugs or smoke while pregnant.  Do not gain enough weight while pregnant.  Became pregnant shortly after having been pregnant. What are the symptoms of preterm labor? Symptoms of preterm labor include:  Cramps similar to those that can happen during a menstrual period. The cramps may happen with diarrhea.  Pain in the abdomen or lower back.  Regular uterine contractions that may feel like tightening of the abdomen.  A feeling of increased pressure in the pelvis.  Increased watery or bloody mucus discharge from the vagina.  Water breaking (ruptured amniotic sac). Why is it important to recognize signs of preterm labor? It is important to recognize signs of preterm labor because babies who are born prematurely may not be fully developed. This can put them at an increased risk for:  Long-term (chronic) heart and lung problems.  Difficulty immediately after birth with regulating body systems, including blood sugar, body temperature, heart rate, and breathing rate.  Bleeding in the brain.  Cerebral palsy.  Learning difficulties.  Death. These risks are highest for babies who are born before 34 weeks of pregnancy. How is preterm labor treated? Treatment depends on the length of your pregnancy, your condition, and the health of your baby. It may involve:  Having a stitch (suture) placed in your cervix to prevent your cervix from opening too early (cerclage).  Taking or being given medicines, such as: ? Hormone medicines. These may be given early in pregnancy to help support the pregnancy. ? Medicine to stop contractions. ? Medicines to help mature the babys lungs. These may be prescribed if the risk of delivery is high. ? Medicines to prevent your baby from developing cerebral palsy. If the labor happens before 34 weeks of pregnancy, you may need to  stay in the hospital. What should I do if I think I am in preterm labor? If you think that you are going into preterm labor, call your health care provider right away. How can I prevent preterm labor in future pregnancies? To increase your chance of having a full-term pregnancy:  Do not use any tobacco products, such as cigarettes, chewing tobacco, and e-cigarettes. If you need help quitting, ask your health care provider.  Do not use street drugs or medicines that have not been prescribed to you during your pregnancy.  Talk with your health care provider before taking any herbal supplements, even if you have been taking them regularly.  Make sure you gain a healthy amount of weight during your  pregnancy.  Watch for infection. If you think that you might have an infection, get it checked right away.  Make sure to tell your health care provider if you have gone into preterm labor before. This information is not intended to replace advice given to you by your health care provider. Make sure you discuss any questions you have with your health care provider. Document Released: 06/29/2003 Document Revised: 07/31/2018 Document Reviewed: 08/30/2015 Elsevier Patient Education  2020 [employer].    Third Trimester of Pregnancy The third trimester is from week 28 through week 40 (months 7 through 9). The third trimester is a time when the unborn baby (fetus) is growing rapidly. At the end of the ninth month, the fetus is about 20 inches in length and weighs 6-10 pounds. Body changes during your third trimester Your body will continue to go through many changes during pregnancy. The changes vary from woman to woman. During the third trimester:  Your weight will continue to increase. You can expect to gain 25-35 pounds (11-16 kg) by the end of the pregnancy.  You may begin to get stretch marks on your hips, abdomen, and breasts.  You may urinate more often because the fetus is moving lower into  your pelvis and pressing on your bladder.  You may develop or continue to have heartburn. This is caused by increased hormones that slow down muscles in the digestive tract.  You may develop or continue to have constipation because increased hormones slow digestion and cause the muscles that push waste through your intestines to relax.  You may develop hemorrhoids. These are swollen veins (varicose veins) in the rectum that can itch or be painful.  You may develop swollen, bulging veins (varicose veins) in your legs.  You may have increased body aches in the pelvis, back, or thighs. This is due to weight gain and increased hormones that are relaxing your joints.  You may have changes in your hair. These can include thickening of your hair, rapid growth, and changes in texture. Some women also have hair loss during or after pregnancy, or hair that feels dry or thin. Your hair will most likely return to normal after your baby is born.  Your breasts will continue to grow and they will continue to become tender. A yellow fluid (colostrum) may leak from your breasts. This is the first milk you are producing for your baby.  Your belly button may stick out.  You may notice more swelling in your hands, face, or ankles.  You may have increased tingling or numbness in your hands, arms, and legs. The skin on your belly may also feel numb.  You may feel short of breath because of your expanding uterus.  You may have more problems sleeping. This can be caused by the size of your belly, increased need to urinate, and an increase in your body's metabolism.  You may notice the fetus "dropping," or moving lower in your abdomen (lightening).  You may have increased vaginal discharge.  You may notice your joints feel loose and you may have pain around your pelvic bone. What to expect at prenatal visits You will have prenatal exams every 2 weeks until week 36. Then you will have weekly prenatal exams.  During a routine prenatal visit:  You will be weighed to make sure you and the baby are growing normally.  Your blood pressure will be taken.  Your abdomen will be measured to track your baby's growth.  The fetal heartbeat  will be listened to.  Any test results from the previous visit will be discussed.  You may have a cervical check near your due date to see if your cervix has softened or thinned (effaced).  You will be tested for Group B streptococcus. This happens between 35 and 37 weeks. Your health care provider may ask you:  What your birth plan is.  How you are feeling.  If you are feeling the baby move.  If you have had any abnormal symptoms, such as leaking fluid, bleeding, severe headaches, or abdominal cramping.  If you are using any tobacco products, including cigarettes, chewing tobacco, and electronic cigarettes.  If you have any questions. Other tests or screenings that may be performed during your third trimester include:  Blood tests that check for low iron levels (anemia).  Fetal testing to check the health, activity level, and growth of the fetus. Testing is done if you have certain medical conditions or if there are problems during the pregnancy.  Nonstress test (NST). This test checks the health of your baby to make sure there are no signs of problems, such as the baby not getting enough oxygen. During this test, a belt is placed around your belly. The baby is made to move, and its heart rate is monitored during movement. What is false labor? False labor is a condition in which you feel small, irregular tightenings of the muscles in the womb (contractions) that usually go away with rest, changing position, or drinking water. These are called Braxton Hicks contractions. Contractions may last for hours, days, or even weeks before true labor sets in. If contractions come at regular intervals, become more frequent, increase in intensity, or become painful, you  should see your health care provider. What are the signs of labor?  Abdominal cramps.  Regular contractions that start at 10 minutes apart and become stronger and more frequent with time.  Contractions that start on the top of the uterus and spread down to the lower abdomen and back.  Increased pelvic pressure and dull back pain.  A watery or bloody mucus discharge that comes from the vagina.  Leaking of amniotic fluid. This is also known as your "water breaking." It could be a slow trickle or a gush. Let your health care provider know if it has a color or strange odor. If you have any of these signs, call your health care provider right away, even if it is before your due date. Follow these instructions at home: Medicines  Follow your health care provider's instructions regarding medicine use. Specific medicines may be either safe or unsafe to take during pregnancy.  Take a prenatal vitamin that contains at least 600 micrograms (mcg) of folic acid.  If you develop constipation, try taking a stool softener if your health care provider approves. Eating and drinking   Eat a balanced diet that includes fresh fruits and vegetables, whole grains, good sources of protein such as meat, eggs, or tofu, and low-fat dairy. Your health care provider will help you determine the amount of weight gain that is right for you.  Avoid raw meat and uncooked cheese. These carry germs that can cause birth defects in the baby.  If you have low calcium intake from food, talk to your health care provider about whether you should take a daily calcium supplement.  Eat four or five small meals rather than three large meals a day.  Limit foods that are high in fat and processed sugars, such as  fried and sweet foods.  To prevent constipation: ? Drink enough fluid to keep your urine clear or pale yellow. ? Eat foods that are high in fiber, such as fresh fruits and vegetables, whole grains, and  beans. Activity  Exercise only as directed by your health care provider. Most women can continue their usual exercise routine during pregnancy. Try to exercise for 30 minutes at least 5 days a week. Stop exercising if you experience uterine contractions.  Avoid heavy lifting.  Do not exercise in extreme heat or humidity, or at high altitudes.  Wear low-heel, comfortable shoes.  Practice good posture.  You may continue to have sex unless your health care provider tells you otherwise. Relieving pain and discomfort  Take frequent breaks and rest with your legs elevated if you have leg cramps or low back pain.  Take warm sitz baths to soothe any pain or discomfort caused by hemorrhoids. Use hemorrhoid cream if your health care provider approves.  Wear a good support bra to prevent discomfort from breast tenderness.  If you develop varicose veins: ? Wear support pantyhose or compression stockings as told by your healthcare provider. ? Elevate your feet for 15 minutes, 3-4 times a day. Prenatal care  Write down your questions. Take them to your prenatal visits.  Keep all your prenatal visits as told by your health care provider. This is important. Safety  Wear your seat belt at all times when driving.  Make a list of emergency phone numbers, including numbers for family, friends, the hospital, and police and fire departments. General instructions  Avoid cat litter boxes and soil used by cats. These carry germs that can cause birth defects in the baby. If you have a cat, ask someone to clean the litter box for you.  Do not travel far distances unless it is absolutely necessary and only with the approval of your health care provider.  Do not use hot tubs, steam rooms, or saunas.  Do not drink alcohol.  Do not use any products that contain nicotine or tobacco, such as cigarettes and e-cigarettes. If you need help quitting, ask your health care provider.  Do not use any medicinal  herbs or unprescribed drugs. These chemicals affect the formation and growth of the baby.  Do not douche or use tampons or scented sanitary pads.  Do not cross your legs for long periods of time.  To prepare for the arrival of your baby: ? Take prenatal classes to understand, practice, and ask questions about labor and delivery. ? Make a trial run to the hospital. ? Visit the hospital and tour the maternity area. ? Arrange for maternity or paternity leave through employers. ? Arrange for family and friends to take care of pets while you are in the hospital. ? Purchase a rear-facing car seat and make sure you know how to install it in your car. ? Pack your hospital bag. ? Prepare the babys nursery. Make sure to remove all pillows and stuffed animals from the baby's crib to prevent suffocation.  Visit your dentist if you have not gone during your pregnancy. Use a soft toothbrush to brush your teeth and be gentle when you floss. Contact a health care provider if:  You are unsure if you are in labor or if your water has broken.  You become dizzy.  You have mild pelvic cramps, pelvic pressure, or nagging pain in your abdominal area.  You have lower back pain.  You have persistent nausea, vomiting, or diarrhea.  You have an unusual or bad smelling vaginal discharge.  You have pain when you urinate. Get help right away if:  Your water breaks before 37 weeks.  You have regular contractions less than 5 minutes apart before 37 weeks.  You have a fever.  You are leaking fluid from your vagina.  You have spotting or bleeding from your vagina.  You have severe abdominal pain or cramping.  You have rapid weight loss or weight gain.  You have shortness of breath with chest pain.  You notice sudden or extreme swelling of your face, hands, ankles, feet, or legs.  Your baby makes fewer than 10 movements in 2 hours.  You have severe headaches that do not go away when you take  medicine.  You have vision changes. Summary  The third trimester is from week 28 through week 40, months 7 through 9. The third trimester is a time when the unborn baby (fetus) is growing rapidly.  During the third trimester, your discomfort may increase as you and your baby continue to gain weight. You may have abdominal, leg, and back pain, sleeping problems, and an increased need to urinate.  During the third trimester your breasts will keep growing and they will continue to become tender. A yellow fluid (colostrum) may leak from your breasts. This is the first milk you are producing for your baby.  False labor is a condition in which you feel small, irregular tightenings of the muscles in the womb (contractions) that eventually go away. These are called Braxton Hicks contractions. Contractions may last for hours, days, or even weeks before true labor sets in.  Signs of labor can include: abdominal cramps; regular contractions that start at 10 minutes apart and become stronger and more frequent with time; watery or bloody mucus discharge that comes from the vagina; increased pelvic pressure and dull back pain; and leaking of amniotic fluid. This information is not intended to replace advice given to you by your health care provider. Make sure you discuss any questions you have with your health care provider. Document Released: 04/02/2001 Document Revised: 07/30/2018 Document Reviewed: 05/14/2016 Elsevier Patient Education  2020 [employer].

## 2019-04-06 ENCOUNTER — Encounter: Payer: No Typology Code available for payment source | Admitting: Advanced Practice Midwife

## 2019-04-09 ENCOUNTER — Encounter: Payer: Self-pay | Admitting: Obstetrics & Gynecology

## 2019-04-09 ENCOUNTER — Ambulatory Visit (INDEPENDENT_AMBULATORY_CARE_PROVIDER_SITE_OTHER): Payer: No Typology Code available for payment source | Admitting: Obstetrics & Gynecology

## 2019-04-09 ENCOUNTER — Other Ambulatory Visit: Payer: Self-pay

## 2019-04-09 VITALS — BP 112/73 | HR 90 | Wt 148.0 lb

## 2019-04-09 DIAGNOSIS — Z3401 Encounter for supervision of normal first pregnancy, first trimester: Secondary | ICD-10-CM

## 2019-04-09 DIAGNOSIS — Z3403 Encounter for supervision of normal first pregnancy, third trimester: Secondary | ICD-10-CM

## 2019-04-09 DIAGNOSIS — Z3A35 35 weeks gestation of pregnancy: Secondary | ICD-10-CM

## 2019-04-09 NOTE — Patient Instructions (Signed)

## 2019-04-14 NOTE — Progress Notes (Signed)
   PRENATAL VISIT NOTE  Subjective:  Caroline Mack is a 28 y.o. G1P0 at [redacted]w[redacted]d being seen today for ongoing prenatal care.  She is currently monitored for the following issues for this low-risk pregnancy and has Encounter for supervision of normal first pregnancy in first trimester on their problem list.  Patient reports no complaints.  Contractions: Not present. Vag. Bleeding: None.  Movement: Present. Denies leaking of fluid.   The following portions of the patient's history were reviewed and updated as appropriate: allergies, current medications, past family history, past medical history, past social history, past surgical history and problem list.   Objective:   Vitals:   04/09/19 0908  BP: 112/73  Pulse: 90  Weight: 148 lb (67.1 kg)    Fetal Status: Fetal Heart Rate (bpm): 145 Fundal Height: 34 cm Movement: Present     General:  Alert, oriented and cooperative. Patient is in no acute distress.  Skin: Skin is warm and dry. No rash noted.   Cardiovascular: Normal heart rate noted  Respiratory: Normal respiratory effort, no problems with respiration noted  Abdomen: Soft, gravid, appropriate for gestational age.  Pain/Pressure: Absent     Pelvic: Cervical exam deferred        Extremities: Normal range of motion.  Edema: None  Mental Status: Normal mood and affect. Normal behavior. Normal judgment and thought content.   Assessment and Plan:  Pregnancy: G1P0 at [redacted]w[redacted]d 1. Encounter for supervision of normal first pregnancy in first trimester FH WNL Reviewed PP contraception  Preterm labor symptoms and general obstetric precautions including but not limited to vaginal bleeding, contractions, leaking of fluid and fetal movement were reviewed in detail with the patient. Please refer to After Visit Summary for other counseling recommendations.   Return in about 2 weeks (around 04/23/2019) for in person.  Future Appointments  Date Time Provider [place]  04/27/2019  9:00 AM  Seabron Spates, CNM [facility] None  05/04/2019  9:30 AM Seabron Spates, CNM [facility] None  05/11/2019  9:00 AM Seabron Spates, CNM [facility] None    Lavonia Drafts, MD

## 2019-04-23 NOTE — L&D Delivery Note (Signed)
Delivery Note Pt was c/o pressure and baby noted to be at +2 station. Membranes were obviously ruptured because hair was visible, but no c/o wetness w/SROM. After a one hour 2nd stage, at 8:54 PM a viable female was delivered via Vaginal, Spontaneous (Presentation: Right Occiput Anterior).  APGAR: 9, 9; weight 7 lb 0.9 oz (3201 g).  After 1 minute, the cord was clamped and cut. 40 units of pitocin diluted in 1000cc LR was infused rapidly IV.  The placenta separated spontaneously and delivered via CCT and maternal pushing effort.  It was inspected and appears to be intact with a 3 VC.  Placenta status: Spontaneous, Intact.  Cord: 3 vessels with the following complications: None.   Anesthesia: Epidural Episiotomy: None Lacerations: 2nd degree;Perineal Suture Repair: 2.0 vicryl Est. Blood Loss (mL): 151  Mom to postpartum.  Baby to Couplet care / Skin to Skin.  Jacklyn Shell 05/17/2019, 10:38 PM

## 2019-04-27 ENCOUNTER — Encounter: Payer: Self-pay | Admitting: Advanced Practice Midwife

## 2019-04-27 ENCOUNTER — Ambulatory Visit (INDEPENDENT_AMBULATORY_CARE_PROVIDER_SITE_OTHER): Payer: No Typology Code available for payment source | Admitting: Advanced Practice Midwife

## 2019-04-27 ENCOUNTER — Other Ambulatory Visit: Payer: Self-pay

## 2019-04-27 VITALS — BP 110/80 | HR 119 | Wt 152.0 lb

## 2019-04-27 DIAGNOSIS — Z113 Encounter for screening for infections with a predominantly sexual mode of transmission: Secondary | ICD-10-CM

## 2019-04-27 DIAGNOSIS — Z3401 Encounter for supervision of normal first pregnancy, first trimester: Secondary | ICD-10-CM

## 2019-04-27 DIAGNOSIS — Z3403 Encounter for supervision of normal first pregnancy, third trimester: Secondary | ICD-10-CM

## 2019-04-27 DIAGNOSIS — Z3A37 37 weeks gestation of pregnancy: Secondary | ICD-10-CM

## 2019-04-27 NOTE — Patient Instructions (Signed)

## 2019-04-27 NOTE — Progress Notes (Signed)
   PRENATAL VISIT NOTE  Subjective:  Caroline Mack is a 29 y.o. G1P0 at [redacted]w[redacted]d being seen today for ongoing prenatal care.  She is currently monitored for the following issues for this low-risk pregnancy and has Encounter for supervision of normal first pregnancy in first trimester on their problem list.  Patient reports occasional contractions.  Contractions: Irritability. Vag. Bleeding: None.  Movement: Present. Denies leaking of fluid.   The following portions of the patient's history were reviewed and updated as appropriate: allergies, current medications, past family history, past medical history, past social history, past surgical history and problem list.   Objective:   Vitals:   04/27/19 0907  BP: 110/80  Pulse: (!) 119  Weight: 152 lb (68.9 kg)    Fetal Status:     Movement: Present     General:  Alert, oriented and cooperative. Patient is in no acute distress.  Skin: Skin is warm and dry. No rash noted.   Cardiovascular: Normal heart rate noted  Respiratory: Normal respiratory effort, no problems with respiration noted  Abdomen: Soft, gravid, appropriate for gestational age.  Pain/Pressure: Absent     Pelvic: Cervical exam performed       1.5/50/-1/vtx  Extremities: Normal range of motion.  Edema: None  Mental Status: Normal mood and affect. Normal behavior. Normal judgment and thought content.   Assessment and Plan:  Pregnancy: G1P0 at [redacted]w[redacted]d 1. Encounter for supervision of normal first pregnancy in first trimester Reviewed labor signs Reviewed where to go - Culture, beta strep (group b only) - GC/Chlamydia probe amp ([place])not at [facility]  Term labor symptoms and general obstetric precautions including but not limited to vaginal bleeding, contractions, leaking of fluid and fetal movement were reviewed in detail with the patient. Please refer to After Visit Summary for other counseling recommendations.   Return in about 1 week (around 05/04/2019) for [facility].  Future Appointments  Date Time Provider Department Center  05/04/2019  9:30 AM Aviva Signs, CNM [facility] None  05/11/2019  9:00 AM Aviva Signs, CNM [facility] None    Wynelle Bourgeois, CNM

## 2019-04-28 LAB — GC/CHLAMYDIA PROBE AMP (~~LOC~~) NOT AT ARMC
Chlamydia: NEGATIVE
Comment: NEGATIVE
Comment: NORMAL
Neisseria Gonorrhea: NEGATIVE

## 2019-05-01 LAB — CULTURE, BETA STREP (GROUP B ONLY): Strep Gp B Culture: NEGATIVE

## 2019-05-04 ENCOUNTER — Other Ambulatory Visit: Payer: Self-pay

## 2019-05-04 ENCOUNTER — Ambulatory Visit (INDEPENDENT_AMBULATORY_CARE_PROVIDER_SITE_OTHER): Payer: No Typology Code available for payment source | Admitting: Advanced Practice Midwife

## 2019-05-04 VITALS — BP 115/72 | HR 95 | Wt 152.0 lb

## 2019-05-04 DIAGNOSIS — Z3A38 38 weeks gestation of pregnancy: Secondary | ICD-10-CM

## 2019-05-04 DIAGNOSIS — Z3403 Encounter for supervision of normal first pregnancy, third trimester: Secondary | ICD-10-CM

## 2019-05-04 DIAGNOSIS — Z3400 Encounter for supervision of normal first pregnancy, unspecified trimester: Secondary | ICD-10-CM

## 2019-05-04 NOTE — Progress Notes (Signed)
   PRENATAL VISIT NOTE  Subjective:  NEKESHIA LENHARDT is a 29 y.o. G1P0 at [redacted]w[redacted]d being seen today for ongoing prenatal care.  She is currently monitored for the following issues for this low-risk pregnancy and has Encounter for supervision of normal first pregnancy in first trimester on their problem list.  Patient reports occasional contractions.  Contractions: Irregular. Vag. Bleeding: None.  Movement: Present. Denies leaking of fluid.   The following portions of the patient's history were reviewed and updated as appropriate: allergies, current medications, past family history, past medical history, past social history, past surgical history and problem list.   Objective:   Vitals:   05/04/19 0938  BP: 115/72  Pulse: 95  Weight: 152 lb (68.9 kg)    Fetal Status: Fetal Heart Rate (bpm): 139   Movement: Present     General:  Alert, oriented and cooperative. Patient is in no acute distress.  Skin: Skin is warm and dry. No rash noted.   Cardiovascular: Normal heart rate noted  Respiratory: Normal respiratory effort, no problems with respiration noted  Abdomen: Soft, gravid, appropriate for gestational age.  Pain/Pressure: Absent     Pelvic: Cervical exam performed      2/60/-1/vtx   Extremities: Normal range of motion.  Edema: None  Mental Status: Normal mood and affect. Normal behavior. Normal judgment and thought content.   Assessment and Plan:  Pregnancy: G1P0 at [redacted]w[redacted]d There are no diagnoses linked to this encounter. Preterm labor symptoms and general obstetric precautions including but not limited to vaginal bleeding, contractions, leaking of fluid and fetal movement were reviewed in detail with the patient. Please refer to After Visit Summary for other counseling recommendations.     Future Appointments  Date Time Provider Department Center  05/11/2019  9:00 AM Aviva Signs, CNM [facility] None    Wynelle Bourgeois, CNM

## 2019-05-04 NOTE — Patient Instructions (Signed)

## 2019-05-11 ENCOUNTER — Ambulatory Visit (INDEPENDENT_AMBULATORY_CARE_PROVIDER_SITE_OTHER): Payer: No Typology Code available for payment source | Admitting: Advanced Practice Midwife

## 2019-05-11 ENCOUNTER — Ambulatory Visit (HOSPITAL_COMMUNITY): Payer: No Typology Code available for payment source | Admitting: *Deleted

## 2019-05-11 ENCOUNTER — Encounter: Payer: Self-pay | Admitting: Advanced Practice Midwife

## 2019-05-11 ENCOUNTER — Encounter (HOSPITAL_COMMUNITY): Payer: Self-pay | Admitting: *Deleted

## 2019-05-11 ENCOUNTER — Other Ambulatory Visit: Payer: Self-pay

## 2019-05-11 ENCOUNTER — Ambulatory Visit (HOSPITAL_COMMUNITY)
Admission: RE | Admit: 2019-05-11 | Discharge: 2019-05-11 | Disposition: A | Discharge status: Home / Self Care | Payer: No Typology Code available for payment source | Source: Ambulatory Visit | Attending: Advanced Practice Midwife | Admitting: Advanced Practice Midwife

## 2019-05-11 VITALS — BP 111/74 | HR 83 | Wt 154.0 lb

## 2019-05-11 DIAGNOSIS — Z3401 Encounter for supervision of normal first pregnancy, first trimester: Secondary | ICD-10-CM | POA: Insufficient documentation

## 2019-05-11 DIAGNOSIS — Z3A39 39 weeks gestation of pregnancy: Secondary | ICD-10-CM

## 2019-05-11 DIAGNOSIS — O26843 Uterine size-date discrepancy, third trimester: Secondary | ICD-10-CM | POA: Diagnosis present

## 2019-05-11 DIAGNOSIS — Z362 Encounter for other antenatal screening follow-up: Secondary | ICD-10-CM | POA: Diagnosis not present

## 2019-05-11 NOTE — Progress Notes (Signed)
   PRENATAL VISIT NOTE  Subjective:  Caroline Mack is a 29 y.o. G1P0 at [redacted]w[redacted]d being seen today for ongoing prenatal care.  She is currently monitored for the following issues for this low-risk pregnancy and has Encounter for supervision of normal first pregnancy in first trimester on their problem list.  Patient reports occasional contractions.  Contractions: Irregular. Vag. Bleeding: None.  Movement: Present. Denies leaking of fluid.   The following portions of the patient's history were reviewed and updated as appropriate: allergies, current medications, past family history, past medical history, past social history, past surgical history and problem list.   Objective:   Vitals:   05/11/19 0904  BP: 111/74  Pulse: 83  Weight: 154 lb (69.9 kg)    Fetal Status:     Movement: Present     General:  Alert, oriented and cooperative. Patient is in no acute distress.  Skin: Skin is warm and dry. No rash noted.   Cardiovascular: Normal heart rate noted  Respiratory: Normal respiratory effort, no problems with respiration noted  Abdomen: Soft, gravid, appropriate for gestational age.  Pain/Pressure: Absent     Pelvic: Cervical exam performed      Cervix 3+/60/-2/vertex  Extremities: Normal range of motion.  Edema: None  Mental Status: Normal mood and affect. Normal behavior. Normal judgment and thought content.   Assessment and Plan:  Pregnancy: G1P0 at [redacted]w[redacted]d 1. Uterine size-date discrepancy in third trimester      May be due to low station and constitution, but want to check AFI and growth      Discussed if fluid is low or growth low, will likely recommend IOL - [place] MFM OB FOLLOW UP; Future  Term labor symptoms and general obstetric precautions including but not limited to vaginal bleeding, contractions, leaking of fluid and fetal movement were reviewed in detail with the patient. Please refer to After Visit Summary for other counseling recommendations.   Return in about 1 week  (around 05/18/2019) for [facility].    Wynelle Bourgeois, CNM

## 2019-05-11 NOTE — Patient Instructions (Signed)

## 2019-05-12 ENCOUNTER — Telehealth (HOSPITAL_COMMUNITY): Payer: Self-pay | Admitting: *Deleted

## 2019-05-12 ENCOUNTER — Encounter: Payer: Self-pay | Admitting: Advanced Practice Midwife

## 2019-05-12 ENCOUNTER — Other Ambulatory Visit: Payer: Self-pay | Admitting: Advanced Practice Midwife

## 2019-05-12 DIAGNOSIS — O4100X0 Oligohydramnios, unspecified trimester, not applicable or unspecified: Secondary | ICD-10-CM | POA: Insufficient documentation

## 2019-05-12 NOTE — Telephone Encounter (Signed)
Preadmission screen  

## 2019-05-13 ENCOUNTER — Encounter (HOSPITAL_COMMUNITY): Payer: Self-pay | Admitting: *Deleted

## 2019-05-13 ENCOUNTER — Telehealth (HOSPITAL_COMMUNITY): Payer: Self-pay | Admitting: *Deleted

## 2019-05-13 NOTE — Telephone Encounter (Signed)
Preadmission screen  

## 2019-05-14 ENCOUNTER — Other Ambulatory Visit: Payer: Self-pay

## 2019-05-14 ENCOUNTER — Ambulatory Visit (INDEPENDENT_AMBULATORY_CARE_PROVIDER_SITE_OTHER): Payer: No Typology Code available for payment source | Admitting: Family Medicine

## 2019-05-14 VITALS — BP 117/78 | HR 88 | Wt 154.0 lb

## 2019-05-14 DIAGNOSIS — Z3A39 39 weeks gestation of pregnancy: Secondary | ICD-10-CM

## 2019-05-14 DIAGNOSIS — Z3403 Encounter for supervision of normal first pregnancy, third trimester: Secondary | ICD-10-CM

## 2019-05-14 DIAGNOSIS — Z3400 Encounter for supervision of normal first pregnancy, unspecified trimester: Secondary | ICD-10-CM

## 2019-05-14 NOTE — Progress Notes (Signed)
   PRENATAL VISIT NOTE  Subjective:  Caroline Mack is a 29 y.o. G1P0 at [redacted]w[redacted]d being seen today for ongoing prenatal care.  She is currently monitored for the following issues for this low-risk pregnancy and has Encounter for supervision of normal first pregnancy in first trimester; Size of fetus inconsistent with dates in third trimester; and Oligohydramnios antepartum on their problem list.  Patient reports no complaints.  Contractions: Irregular. Vag. Bleeding: None.  Movement: Present. Denies leaking of fluid.   The following portions of the patient's history were reviewed and updated as appropriate: allergies, current medications, past family history, past medical history, past social history, past surgical history and problem list.   Objective:   Vitals:   05/14/19 1123 05/14/19 1142  BP: (!) 113/95 117/78  Pulse: 96 88  Weight: 154 lb (69.9 kg)     Fetal Status: Fetal Heart Rate (bpm): 145 Fundal Height: 37 cm Movement: Present  Presentation: Vertex  General:  Alert, oriented and cooperative. Patient is in no acute distress.  Skin: Skin is warm and dry. No rash noted.   Cardiovascular: Normal heart rate noted  Respiratory: Normal respiratory effort, no problems with respiration noted  Abdomen: Soft, gravid, appropriate for gestational age.  Pain/Pressure: Absent     Pelvic: Cervical exam deferred Dilation: 3.5 Effacement (%): 60    Extremities: Normal range of motion.  Edema: None  Mental Status: Normal mood and affect. Normal behavior. Normal judgment and thought content.   Assessment and Plan:  Pregnancy: G1P0 at [redacted]w[redacted]d  1. Supervision of normal first pregnancy, antepartum ? Oligohydramnios on [place]. Scheduled for induction on Monday. Would recommend pitocin induction.  Term labor symptoms and general obstetric precautions including but not limited to vaginal bleeding, contractions, leaking of fluid and fetal movement were reviewed in detail with the patient. Please refer  to After Visit Summary for other counseling recommendations.   No follow-ups on file.  Future Appointments  Date Time Provider Department Center  05/15/2019 10:10 AM [facility]SCREENING [facility] None  05/17/2019  8:00 AM [facility]-LD SCHED ROOM [facility] None  06/24/2019  9:30 AM Levie Heritage, DO [facility] None    Levie Heritage, DO

## 2019-05-15 ENCOUNTER — Other Ambulatory Visit (HOSPITAL_COMMUNITY)
Admission: RE | Admit: 2019-05-15 | Discharge: 2019-05-15 | Disposition: A | Discharge status: Home / Self Care | Payer: No Typology Code available for payment source | Source: Ambulatory Visit | Attending: Obstetrics & Gynecology | Admitting: Obstetrics & Gynecology

## 2019-05-15 LAB — SARS CORONAVIRUS 2 (TAT 6-24 HRS): SARS Coronavirus 2: NEGATIVE

## 2019-05-17 ENCOUNTER — Inpatient Hospital Stay (HOSPITAL_COMMUNITY): Payer: No Typology Code available for payment source | Admitting: Anesthesiology

## 2019-05-17 ENCOUNTER — Other Ambulatory Visit: Payer: Self-pay

## 2019-05-17 ENCOUNTER — Encounter (HOSPITAL_COMMUNITY): Payer: Self-pay | Admitting: Obstetrics and Gynecology

## 2019-05-17 ENCOUNTER — Inpatient Hospital Stay (HOSPITAL_COMMUNITY)
Admission: AD | Admit: 2019-05-17 | Discharge: 2019-05-19 | DRG: 807 | Disposition: A | Discharge status: Home / Self Care | Payer: No Typology Code available for payment source | Attending: Obstetrics and Gynecology | Admitting: Obstetrics and Gynecology

## 2019-05-17 ENCOUNTER — Inpatient Hospital Stay (HOSPITAL_COMMUNITY): Payer: No Typology Code available for payment source

## 2019-05-17 DIAGNOSIS — O4100X0 Oligohydramnios, unspecified trimester, not applicable or unspecified: Secondary | ICD-10-CM | POA: Diagnosis present

## 2019-05-17 DIAGNOSIS — Z20822 Contact with and (suspected) exposure to covid-19: Secondary | ICD-10-CM | POA: Diagnosis present

## 2019-05-17 DIAGNOSIS — O4103X0 Oligohydramnios, third trimester, not applicable or unspecified: Secondary | ICD-10-CM | POA: Diagnosis present

## 2019-05-17 DIAGNOSIS — Z3A40 40 weeks gestation of pregnancy: Secondary | ICD-10-CM

## 2019-05-17 LAB — TYPE AND SCREEN
ABO/RH(D): A POS
Antibody Screen: NEGATIVE

## 2019-05-17 LAB — ABO/RH: ABO/RH(D): A POS

## 2019-05-17 LAB — CBC
HCT: 38.3 % (ref 36.0–46.0)
Hemoglobin: 13.1 g/dL (ref 12.0–15.0)
MCH: 32.8 pg (ref 26.0–34.0)
MCHC: 34.2 g/dL (ref 30.0–36.0)
MCV: 96 fL (ref 80.0–100.0)
Platelets: 226 10*3/uL (ref 150–400)
RBC: 3.99 MIL/uL (ref 3.87–5.11)
RDW: 12.7 % (ref 11.5–15.5)
WBC: 9.4 10*3/uL (ref 4.0–10.5)
nRBC: 0 % (ref 0.0–0.2)

## 2019-05-17 LAB — RPR: RPR Ser Ql: NONREACTIVE

## 2019-05-17 MED ORDER — PHENYLEPHRINE 40 MCG/ML (10ML) SYRINGE FOR IV PUSH (FOR BLOOD PRESSURE SUPPORT)
80.0000 ug | PREFILLED_SYRINGE | INTRAVENOUS | Status: DC | PRN
  Administered 2019-05-17: 80 ug via INTRAVENOUS

## 2019-05-17 MED ORDER — FENTANYL CITRATE (PF) 100 MCG/2ML IJ SOLN: 100.0000 ug | INTRAMUSCULAR | Status: DC | PRN

## 2019-05-17 MED ORDER — OXYTOCIN 40 UNITS IN NORMAL SALINE INFUSION - SIMPLE MED
1.0000 m[IU]/min | INTRAVENOUS | Status: DC
  Administered 2019-05-17: 2 m[IU]/min via INTRAVENOUS

## 2019-05-17 MED ORDER — ACETAMINOPHEN 325 MG PO TABS: 650.0000 mg | ORAL_TABLET | ORAL | Status: DC | PRN

## 2019-05-17 MED ORDER — MISOPROSTOL 25 MCG QUARTER TABLET: 25.0000 ug | ORAL_TABLET | ORAL | Status: DC | PRN

## 2019-05-17 MED ORDER — SOD CITRATE-CITRIC ACID 500-334 MG/5ML PO SOLN: 30.0000 mL | ORAL | Status: DC | PRN

## 2019-05-17 MED ORDER — ONDANSETRON HCL 4 MG/2ML IJ SOLN: 4.0000 mg | Freq: Four times a day (QID) | INTRAMUSCULAR | Status: DC | PRN

## 2019-05-17 MED ORDER — FERROUS SULFATE 325 (65 FE) MG PO TABS
325.0000 mg | ORAL_TABLET | Freq: Two times a day (BID) | ORAL | Status: DC
  Administered 2019-05-18 – 2019-05-19 (×3): 325 mg via ORAL
  Filled 2019-05-17 (×3): qty 1

## 2019-05-17 MED ORDER — OXYTOCIN 40 UNITS IN NORMAL SALINE INFUSION - SIMPLE MED
2.5000 [IU]/h | INTRAVENOUS | Status: DC
  Filled 2019-05-17: qty 1000

## 2019-05-17 MED ORDER — BISACODYL 10 MG RE SUPP: 10.0000 mg | Freq: Every day | RECTAL | Status: DC | PRN

## 2019-05-17 MED ORDER — FLEET ENEMA 7-19 GM/118ML RE ENEM: 1.0000 | ENEMA | Freq: Every day | RECTAL | Status: DC | PRN

## 2019-05-17 MED ORDER — DIBUCAINE (PERIANAL) 1 % EX OINT: 1.0000 "application " | TOPICAL_OINTMENT | CUTANEOUS | Status: DC | PRN

## 2019-05-17 MED ORDER — EPHEDRINE 5 MG/ML INJ: 10.0000 mg | INTRAVENOUS | Status: DC | PRN

## 2019-05-17 MED ORDER — OXYCODONE-ACETAMINOPHEN 5-325 MG PO TABS: 1.0000 | ORAL_TABLET | ORAL | Status: DC | PRN

## 2019-05-17 MED ORDER — LACTATED RINGERS IV SOLN
500.0000 mL | Freq: Once | INTRAVENOUS | Status: AC
  Administered 2019-05-17: 1000 mL via INTRAVENOUS

## 2019-05-17 MED ORDER — TERBUTALINE SULFATE 1 MG/ML IJ SOLN
0.2500 mg | Freq: Once | INTRAMUSCULAR | Status: AC | PRN
  Administered 2019-05-17: 0.25 mg via SUBCUTANEOUS

## 2019-05-17 MED ORDER — MEASLES, MUMPS & RUBELLA VAC IJ SOLR: 0.5000 mL | Freq: Once | INTRAMUSCULAR | Status: DC

## 2019-05-17 MED ORDER — ZOLPIDEM TARTRATE 5 MG PO TABS: 5.0000 mg | ORAL_TABLET | Freq: Every evening | ORAL | Status: DC | PRN

## 2019-05-17 MED ORDER — DIPHENHYDRAMINE HCL 25 MG PO CAPS: 25.0000 mg | ORAL_CAPSULE | Freq: Four times a day (QID) | ORAL | Status: DC | PRN

## 2019-05-17 MED ORDER — TERBUTALINE SULFATE 1 MG/ML IJ SOLN
0.2500 mg | Freq: Once | INTRAMUSCULAR | Status: DC | PRN
  Filled 2019-05-17: qty 1

## 2019-05-17 MED ORDER — SODIUM CHLORIDE (PF) 0.9 % IJ SOLN
INTRAMUSCULAR | Status: DC | PRN
  Administered 2019-05-17: 12 mL/h via EPIDURAL

## 2019-05-17 MED ORDER — LACTATED RINGERS IV SOLN: INTRAVENOUS | Status: DC

## 2019-05-17 MED ORDER — IBUPROFEN 600 MG PO TABS
600.0000 mg | ORAL_TABLET | Freq: Four times a day (QID) | ORAL | Status: DC
  Administered 2019-05-18 – 2019-05-19 (×7): 600 mg via ORAL
  Filled 2019-05-17 (×7): qty 1

## 2019-05-17 MED ORDER — ONDANSETRON HCL 4 MG/2ML IJ SOLN: 4.0000 mg | INTRAMUSCULAR | Status: DC | PRN

## 2019-05-17 MED ORDER — OXYTOCIN BOLUS FROM INFUSION
500.0000 mL | Freq: Once | INTRAVENOUS | Status: AC
  Administered 2019-05-17: 500 mL via INTRAVENOUS

## 2019-05-17 MED ORDER — METHYLERGONOVINE MALEATE 0.2 MG PO TABS: 0.2000 mg | ORAL_TABLET | ORAL | Status: DC | PRN

## 2019-05-17 MED ORDER — ONDANSETRON HCL 4 MG PO TABS: 4.0000 mg | ORAL_TABLET | ORAL | Status: DC | PRN

## 2019-05-17 MED ORDER — DIPHENHYDRAMINE HCL 50 MG/ML IJ SOLN: 12.5000 mg | INTRAMUSCULAR | Status: DC | PRN

## 2019-05-17 MED ORDER — SIMETHICONE 80 MG PO CHEW: 80.0000 mg | CHEWABLE_TABLET | ORAL | Status: DC | PRN

## 2019-05-17 MED ORDER — LIDOCAINE HCL (PF) 1 % IJ SOLN
INTRAMUSCULAR | Status: DC | PRN
  Administered 2019-05-17: 11 mL via EPIDURAL

## 2019-05-17 MED ORDER — DOCUSATE SODIUM 100 MG PO CAPS
100.0000 mg | ORAL_CAPSULE | Freq: Two times a day (BID) | ORAL | Status: DC
  Administered 2019-05-18 – 2019-05-19 (×3): 100 mg via ORAL
  Filled 2019-05-17 (×3): qty 1

## 2019-05-17 MED ORDER — COCONUT OIL OIL
1.0000 "application " | TOPICAL_OIL | Status: DC | PRN
  Administered 2019-05-18: 1 via TOPICAL

## 2019-05-17 MED ORDER — PHENYLEPHRINE 40 MCG/ML (10ML) SYRINGE FOR IV PUSH (FOR BLOOD PRESSURE SUPPORT)
80.0000 ug | PREFILLED_SYRINGE | INTRAVENOUS | Status: DC | PRN
  Filled 2019-05-17: qty 10

## 2019-05-17 MED ORDER — WITCH HAZEL-GLYCERIN EX PADS: 1.0000 "application " | MEDICATED_PAD | CUTANEOUS | Status: DC | PRN

## 2019-05-17 MED ORDER — METHYLERGONOVINE MALEATE 0.2 MG/ML IJ SOLN: 0.2000 mg | INTRAMUSCULAR | Status: DC | PRN

## 2019-05-17 MED ORDER — TETANUS-DIPHTH-ACELL PERTUSSIS 5-2.5-18.5 LF-MCG/0.5 IM SUSP: 0.5000 mL | Freq: Once | INTRAMUSCULAR | Status: DC

## 2019-05-17 MED ORDER — BENZOCAINE-MENTHOL 20-0.5 % EX AERO
1.0000 "application " | INHALATION_SPRAY | CUTANEOUS | Status: DC | PRN
  Administered 2019-05-18: 1 via TOPICAL
  Filled 2019-05-17: qty 56

## 2019-05-17 MED ORDER — FENTANYL-BUPIVACAINE-NACL 0.5-0.125-0.9 MG/250ML-% EP SOLN
12.0000 mL/h | EPIDURAL | Status: DC | PRN
  Filled 2019-05-17: qty 250

## 2019-05-17 MED ORDER — LIDOCAINE HCL (PF) 1 % IJ SOLN: 30.0000 mL | INTRAMUSCULAR | Status: DC | PRN

## 2019-05-17 MED ORDER — PRENATAL MULTIVITAMIN CH
1.0000 | ORAL_TABLET | Freq: Every day | ORAL | Status: DC
  Administered 2019-05-18 – 2019-05-19 (×2): 1 via ORAL
  Filled 2019-05-17 (×2): qty 1

## 2019-05-17 MED ORDER — LACTATED RINGERS IV SOLN: 500.0000 mL | INTRAVENOUS | Status: DC | PRN

## 2019-05-17 MED ORDER — OXYCODONE-ACETAMINOPHEN 5-325 MG PO TABS: 2.0000 | ORAL_TABLET | ORAL | Status: DC | PRN

## 2019-05-17 NOTE — H&P (Addendum)
OBSTETRIC ADMISSION HISTORY AND PHYSICAL  Caroline Mack is a 29 y.o. female G1P0 with IUP at 83w0dpresenting for decreased AFI (6.28 on 05/11/19). She reports +FMs. No LOF, VB, blurry vision, headaches, peripheral edema, or RUQ pain. She plans on breast feeding. She is unsure of her contraceptive method at this time.  Dating: By 7 week u/s --->  Estimated Date of Delivery: 05/17/19  Sono: 05/11/2019 @ 363w0dnormal anatomy, cephalic presentation, [id number]%ile, EFW 7lb 12 oz   Prenatal History/Complications: Low risk pregnancy Low AFI  Past Medical History: Past Medical History:  Diagnosis Date  . Medical history non-contributory     Past Surgical History: Past Surgical History:  Procedure Laterality Date  . ear tumor Left     Obstetrical History: OB History    Gravida  1   Para      Term      Preterm      AB      Living        SAB      TAB      Ectopic      Multiple      Live Births              Social History: Social History   Socioeconomic History  . Marital status: Married    Spouse name: Not on file  . Number of children: Not on file  . Years of education: Not on file  . Highest education level: Not on file  Occupational History  . Not on file  Tobacco Use  . Smoking status: Never Smoker  . Smokeless tobacco: Never Used  Substance and Sexual Activity  . Alcohol use: Never  . Drug use: Never  . Sexual activity: Yes  Other Topics Concern  . Not on file  Social History Narrative  . Not on file   Social Determinants of Health   Financial Resource Strain:   . Difficulty of Paying Living Expenses: Not on file  Food Insecurity: No Food Insecurity  . Worried About RuCharity fundraisern the Last Year: Never true  . Ran Out of Food in the Last Year: Never true  Transportation Needs: No Transportation Needs  . Lack of Transportation (Medical): No  . Lack of Transportation (Non-Medical): No  Physical Activity:   . Days of Exercise per  Week: Not on file  . Minutes of Exercise per Session: Not on file  Stress:   . Feeling of Stress : Not on file  Social Connections:   . Frequency of Communication with Friends and Family: Not on file  . Frequency of Social Gatherings with Friends and Family: Not on file  . Attends Religious Services: Not on file  . Active Member of Clubs or Organizations: Not on file  . Attends ClArchivisteetings: Not on file  . Marital Status: Not on file    Family History: Family History  Problem Relation Age of Onset  . Diabetes Maternal Grandfather   . Cancer Paternal Grandfather   . Hypertension Neg Hx     Allergies: No Known Allergies  Medications Prior to Admission  Medication Sig Dispense Refill Last Dose  . AMBULATORY NON FORMULARY MEDICATION 1 Device by Other route once a week. Blood pressure Cuff/Medium  Monitored Regularly at home ICD 10: Z34.90 (Patient not taking: Reported on 05/11/2019) 1 kit 0   . Prenatal Vit-Fe Fumarate-FA (PRENATAL MULTIVITAMIN) TABS tablet Take 1 tablet by mouth daily at 12 noon.     .Marland Kitchen  Prenatal Vit-Fe Fumarate-FA (PRENATAL VITAMIN) 27-0.8 MG TABS Take 1 tablet by mouth daily. (Patient not taking: Reported on 05/11/2019) 30 tablet 12      Review of Systems:  All systems reviewed and negative except as stated in HPI  PE: Blood pressure 128/83, pulse 91, temperature 97.7 F (36.5 C), height 5' 6"  (1.676 m), weight 70 kg, last menstrual period 08/10/2018. General appearance: alert, cooperative and no distress Lungs: regular rate and effort Heart: regular rate  Abdomen: soft, non-tender Extremities: Homans sign is negative, no sign of DVT Presentation: cephalic EFM: 683 bpm, moderate variability,  accels, no decels Toco: every 2-3 minutes Dilation: 4 Effacement (%): 60 Station: -1 Exam by:: amanda snm  Prenatal labs: ABO, Rh: A/Positive/-- (06/09 1005) Antibody: Negative (06/09 1005) Rubella: 1.82 (06/09 1005) RPR: Non Reactive (11/06  0853)  HBsAg: Negative (06/09 1005)  HIV: Non Reactive (11/06 0853)  GBS: Negative/-- (01/05 0917)   Prenatal Transfer Tool  Maternal Diabetes: No Genetic Screening: Normal Maternal Ultrasounds/Referrals: Normal Fetal Ultrasounds or other Referrals:  Fetal echo Maternal Substance Abuse:  No Significant Maternal Medications:  None Significant Maternal Lab Results: Group B Strep negative  No results found for this or any previous visit (from the past 24 hour(s)).  Patient Active Problem List   Diagnosis Date Noted  . Oligohydramnios 05/17/2019  . Oligohydramnios antepartum 05/12/2019  . Size of fetus inconsistent with dates in third trimester 05/11/2019  . Encounter for supervision of normal first pregnancy in first trimester 09/29/2018    Assessment: Caroline Mack is a 29 y.o. G1P0 at 90w0dhere for IOL for low amniotic fluid.  1. Labor: Latent 2. FWB: Reassuring, category 1 3. Pain: May have epidural PRN 4. GBS: negative   Plan: Admit to labor and delivery  -SVE, 4/60/-1 -Start pitocin at 2 milli units and increase by 2 milli units every 30 minutes to achieve contractions that are every 2-3 minutes and that are moderate in intensity. -May have epidural PRN -Continuous fetal monitoring.  ADarlin Coco Student-MidWife  05/17/2019, 8:39 AM  Midwife attestation: I have seen and examined this patient; I agree with above documentation in the student's note.   PE: Gen: calm comfortable, NAD Resp: normal effort and rate Abd: gravid  ROS, labs, PMH reviewed  Assessment/Plan: Caroline ROEDERis a 29y.o. G1P0 here for IOL for [place] Admit to LD Labor: latent FWB: Cat I GBS neg Pitocin IOL, anticipate labor progress and SVD  MJulianne Handler CNM  05/17/2019, 9:53 AM

## 2019-05-17 NOTE — Anesthesia Preprocedure Evaluation (Signed)

## 2019-05-17 NOTE — Progress Notes (Addendum)
Caroline Mack is a 29 y.o. G1P0 at [redacted]w[redacted]d by ultrasound admitted for induction of labor due to decreased AFI.  Subjective: Doing well, no complaints  Objective: BP 122/72   Pulse (!) 120   Temp 97.7 F (36.5 C)   Ht 5\' 6"  (1.676 m)   Wt 70 kg   LMP 08/10/2018   BMI 24.91 kg/m  No intake/output data recorded. No intake/output data recorded.  FHT:  FHR: 145 bpm, variability: moderate,  accelerations:  Present,  decelerations:  Present late decelerations and prolonged deceleration UC:   regular, every 1-2 minutes SVE:   Dilation: 7 Effacement (%): 80 Station: 0 Exam by:: m wilkins rnc  SNM at bedside, recurrent late decelerations noted and prolonged deceleration. Patient immediately post epidural, BP remained within normal limits.  Patient rolled to left lateral, rolled to right lateral, pitocin stopped.  Patient moved to hands and knees, terb given.  FHR returned to baseline.  Patient moved out of hands and knees position at 1437 to semi fowlers position.   Labs: Lab Results  Component Value Date   WBC 9.4 05/17/2019   HGB 13.1 05/17/2019   HCT 38.3 05/17/2019   MCV 96.0 05/17/2019   PLT 226 05/17/2019    Assessment / Plan: IOL for decreased AFI    -Stop pitocin  Labor: Progressing normally, Active labor Preeclampsia:   Denies signs and symptoms Fetal Wellbeing:  Category II Pain Control:  Epidural I/D:  n/a Anticipated MOD:  NSVD  05/19/2019 05/17/2019, 2:49 PM  Midwife attestation I agree with the documentation in the student's note.  Recurrent late decels post epidural, likely d/t tachysystole. Pitocin off, terbutaline given, repositioned and good fetal recovery.   05/19/2019, CNM 6:48 PM

## 2019-05-17 NOTE — Anesthesia Procedure Notes (Signed)
Epidural Patient location during procedure: OB Start time: 05/17/2019 2:02 PM End time: 05/17/2019 2:15 PM  Staffing Anesthesiologist: Lowella Curb, MD Performed: anesthesiologist   Preanesthetic Checklist Completed: patient identified, IV checked, site marked, risks and benefits discussed, surgical consent, monitors and equipment checked, pre-op evaluation and timeout performed  Epidural Patient position: sitting Prep: ChloraPrep Patient monitoring: heart rate, cardiac monitor, continuous pulse ox and blood pressure Approach: midline Location: L2-L3 Injection technique: LOR saline  Needle:  Needle type: Tuohy  Needle gauge: 17 G Needle length: 9 cm Needle insertion depth: 4 cm Catheter type: closed end flexible Catheter size: 20 Guage Catheter at skin depth: 8 cm Test dose: negative  Assessment Events: blood not aspirated, injection not painful, no injection resistance, no paresthesia and negative IV test  Additional Notes Reason for block:procedure for pain

## 2019-05-17 NOTE — Discharge Summary (Addendum)
Postpartum Discharge Summary    Patient Name: Caroline Mack DOB: 08/06/1990 MRN: [id number]  Date of admission: 05/17/2019 Delivering Provider: Christin Fudge   Date of discharge: 05/19/2019  Admitting diagnosis: Oligohydramnios [O41.00X0] Intrauterine pregnancy: [redacted]w[redacted]d    Secondary diagnosis:  Active Problems:   Oligohydramnios  Additional problems: none     Discharge diagnosis: Term Pregnancy Delivered and oligohydramnios                                                                                                Post partum procedures:n/a  Augmentation: Pitocin  Complications: None  Hospital course:  Induction of Labor With Vaginal Delivery   29y.o. yo G1P0 at 464w0das admitted to the hospital 05/17/2019 for induction of labor.  Indication for induction: low AFI.  Patient had an uncomplicated labor course as follows: Membrane Rupture Time/Date: 7:50 PM ,05/17/2019  (unsure of time of SROM, but noted to be ruptured at [place] Procedures: Episiotomy: None [1]                                         Lacerations:  2nd degree [3];Perineal [11]  Patient had delivery of a Viable infant.  Information for the patient's newborn:  MoLibrada, Castronovoirl TaBrennah0[[id number]]Delivery Method: Vaginal, Spontaneous(Filed from Delivery Summary)    05/17/2019  Details of delivery can be found in separate delivery note.  Patient had a routine postpartum course. Patient is discharged home 05/19/19. Delivery time: 8:54 PM    Magnesium Sulfate received: No BMZ received: No Rhophylac:No MMR:No Transfusion:No  Physical exam  Vitals:   05/18/19 0845 05/18/19 1237 05/18/19 2100 05/19/19 0547  BP: 108/67 116/80 116/75 114/72  Pulse: 68 63 69 70  Resp: 18 18 18 18   Temp: 98 F (36.7 C) 97.9 F (36.6 C) 97.8 F (36.6 C) 98.2 F (36.8 C)  TempSrc: Oral Oral Oral Oral  SpO2: 99% 100% 99% 98%  Weight:      Height:       General: alert, cooperative and no  distress Lochia: appropriate Uterine Fundus: firm Incision: N/A DVT Evaluation: No evidence of DVT seen on physical exam. No cords or calf tenderness. No significant calf/ankle edema. Labs: Lab Results  Component Value Date   WBC 9.4 05/17/2019   HGB 13.1 05/17/2019   HCT 38.3 05/17/2019   MCV 96.0 05/17/2019   PLT 226 05/17/2019   No flowsheet data found.  Discharge instruction: per After Visit Summary and "Baby and Me Booklet".  After visit meds:  Allergies as of 05/19/2019   No Known Allergies     Medication List    STOP taking these medications   AMBULATORY NON FORMULARY MEDICATION     TAKE these medications   acetaminophen 325 MG tablet Commonly known as: Tylenol Take 2 tablets (650 mg total) by mouth every 4 (four) hours as needed (for pain scale < 4).   ibuprofen 600 MG tablet Commonly known as: ADVIL Take 1 tablet (600 mg total)  by mouth every 6 (six) hours.   Prenatal Vitamin 27-0.8 MG Tabs Take 1 tablet by mouth daily.       Diet: routine diet  Activity: Advance as tolerated. Pelvic rest for 6 weeks.   Outpatient follow up:4 weeks Follow up Appt: Future Appointments  Date Time Provider [place]  06/24/2019  9:30 AM Truett Mainland, DO [facility] None   Follow up Visit:    Please schedule this patient for Postpartum visit in: 4 weeks with the following provider: Any provider Virtual For C/S patients schedule nurse incision check in weeks 2 weeks: no Low risk pregnancy complicated by:  Delivery mode:  SVD Anticipated Birth Control:  IUD PP Procedures needed:   Schedule Integrated [place] visit: no     Newborn Data: Live born female  Birth Weight: 7 lb 0.9 oz (3201 g) APGAR: 9, 9  Newborn Delivery   Birth date/time: 05/17/2019 20:54:00 Delivery type: Vaginal, Spontaneous      Baby Feeding: Breast Disposition:home with mother   05/19/2019 Gladys Damme, MD  OB [place]  I have seen and examined this  patient and agree with above documentation in the resident's note.   Phill Myron, D.O. OB Fellow  05/19/2019, 10:28 AM

## 2019-05-18 ENCOUNTER — Encounter: Payer: No Typology Code available for payment source | Admitting: Advanced Practice Midwife

## 2019-05-18 NOTE — Progress Notes (Signed)
Post Partum Day 1 Subjective: no complaints, up ad lib, voiding and tolerating PO, small lochia, plans to breastfeed, IUD outpt Objective: Blood pressure 110/76, pulse 66, temperature 98.2 F (36.8 C), temperature source Oral, resp. rate 18, height 5\' 6"  (1.676 m), weight 70 kg, last menstrual period 08/10/2018, unknown if currently breastfeeding.  Physical Exam:  General: alert, cooperative and no distress Lochia:normal flow Chest: CTAB Heart: RRR no m/r/g Abdomen: +BS, soft, nontender,  Uterine Fundus: firm DVT Evaluation: No evidence of DVT seen on physical exam. Extremities: no edema  Recent Labs    05/17/19 0823  HGB 13.1  HCT 38.3    Assessment/Plan: Plan for discharge tomorrow, Breastfeeding and Lactation consult   LOS: 1 day   05/19/19 05/18/2019, 7:22 AM

## 2019-05-18 NOTE — Lactation Note (Signed)
This note was copied from a baby's chart. Lactation Consultation Note  Patient Name: Caroline Mack WFUXN'A Date: 05/18/2019 Reason for consult: Initial assessment;1st time breastfeeding;Difficult latch;Term P1, 5 hour female infant. 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  Per mom, infant will not sustain latch, infant has been on and off breast, mom made 3 earlier  attempts to breastfed infant,  the longest infant breastfed was  3 minutes. Infant had one void since birth. Tools given: hand pump by RN and DEBP due mom having flat, short shaft nipples and infant not sustain latch. Mom attempted latch infant on right breast infant would not sustain latch after multiple attempts. LC ask mom to pre-pump breast with hand pump, mom fitted with 20 mm NS, mom latched infant on left breast with 20 mm NS and infant sustained latch, few swallows observed, LC worked with infant keeping bottom lip out when nursing. Infant sustained latch with 20 mm NS and was still breastfeeding after 20 minutes when LC left the room. Mom knows to breastfeed infant according to hunger cues, 8 to 12 times within 24 hours, on demand and not exceed 3 hours without breastfeeding infant. Mom will ask RN or LC for latch assistance if needed. Mom will use DEBP every 3 hours for 15 minutes on initial setting.  Mom shown how to use DEBP & how to disassemble, clean, & reassemble parts. Parents will continue to do as much STS with infant as possible. Reviewed Baby & Me book's Breastfeeding Basics.  Mom made aware of O/P services, breastfeeding support groups, community resources, and our phone # for post-discharge questions.  Mom's current 24 hour plan: 1. Mom will pre-pump breast, apply 20 mm NS and breastfeed infant according to hunger cues, 8 to 12 times within 24 hours and not exceed 3 hours without breastfeeding infant. 2.  Mom will pump every 3 hours for 15 minutes with DEBP. 3. Mom will give infant any EBM from using DEBP or hand expression. 4. Parents will continue to do STS with infant.   Maternal Data Formula Feeding for Exclusion: No Has patient been taught Hand Expression?: Yes Does the patient have breastfeeding experience prior to this delivery?: No  Feeding Feeding Type: Breast Fed  LATCH Score Latch: Repeated attempts needed to sustain latch, nipple held in mouth throughout feeding, stimulation needed to elicit sucking reflex.  Audible Swallowing: A few with stimulation  Type of Nipple: Flat  Comfort (Breast/Nipple): Soft / non-tender  Hold (Positioning): Assistance needed to correctly position infant at breast and maintain latch.  LATCH Score: 6  Interventions Interventions: Breast feeding basics reviewed;Breast compression;Adjust position;Assisted with latch;Hand pump;DEBP;Support pillows;Skin to skin;Breast massage;Position options;Expressed milk;Hand express;Pre-pump if needed  Lactation Tools Discussed/Used Tools: Pump;Nipple Shields Nipple shield size: 20(flat and short shafted) Breast pump type: Double-Electric Breast Pump;Manual WIC Program: No Pump Review: Setup, frequency, and cleaning;Milk Storage Initiated by:: Caroline Mack, IBCLC Date initiated:: 05/18/19   Consult Status Consult Status: Follow-up Date: 05/18/19 Follow-up type: In-patient    Caroline Mack 05/18/2019, 2:32 AM

## 2019-05-18 NOTE — Lactation Note (Signed)
This note was copied from a baby's chart. Lactation Consultation Note  Patient Name: Caroline Mack TRRNH'A Date: 05/18/2019 Reason for consult: Follow-up assessment   Baby 14 hours old and RN requested assistance. Reviewed hand expression with drops expressed. 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  Provided her with Employee DEBP. Attempted latching in cross cradle hold and baby spit up x 2. Baby briefly latched and nipple everted well.  Baby fell back asleep. Left baby STS on mother's chest. Encouraged family to call for assistance with next feeding. Mother has been using #20NS to latch and mother pumped drops and gave to baby.   Maternal Data    Feeding Feeding Type: Breast Fed  LATCH Score Latch: Repeated attempts needed to sustain latch, nipple held in mouth throughout feeding, stimulation needed to elicit sucking reflex.  Audible Swallowing: None  Type of Nipple: Everted at rest and after stimulation  Comfort (Breast/Nipple): Filling, red/small blisters or bruises, mild/mod discomfort  Hold (Positioning): Assistance needed to correctly position infant at breast and maintain latch.  LATCH Score: 5  Interventions Interventions: Breast feeding basics reviewed;Support pillows;Adjust position  Lactation Tools Discussed/Used Tools: Nipple Shields Nipple shield size: 20 Breast pump type: Double-Electric Breast Pump(encouraged to pump before feeding)   Consult Status      Hardie Pulley 05/18/2019, 11:19 AM

## 2019-05-18 NOTE — Plan of Care (Signed)
  Problem: Clinical Measurements: Goal: Diagnostic test results will improve Outcome: Completed/Met Goal: Respiratory complications will improve Outcome: Completed/Met Goal: Cardiovascular complication will be avoided Outcome: Completed/Met   Problem: Activity: Goal: Risk for activity intolerance will decrease Outcome: Completed/Met   Problem: Pain Managment: Goal: General experience of comfort will improve Outcome: Completed/Met   Problem: Safety: Goal: Ability to remain free from injury will improve Outcome: Completed/Met   Problem: Activity: Goal: Will verbalize the importance of balancing activity with adequate rest periods Outcome: Completed/Met Goal: Ability to tolerate increased activity will improve Outcome: Completed/Met   Problem: Life Cycle: Goal: Chance of risk for complications during the postpartum period will decrease Outcome: Completed/Met

## 2019-05-18 NOTE — Anesthesia Postprocedure Evaluation (Signed)
Anesthesia Post Note  Patient: Caroline Mack  Procedure(s) Performed: AN AD HOC LABOR EPIDURAL     Patient location during evaluation: Mother Baby Anesthesia Type: Epidural Level of consciousness: awake and alert Pain management: pain level controlled Vital Signs Assessment: post-procedure vital signs reviewed and stable Respiratory status: spontaneous breathing, nonlabored ventilation and respiratory function stable Cardiovascular status: stable Postop Assessment: no headache, no backache and epidural receding Anesthetic complications: no    Last Vitals:  Vitals:   05/18/19 0018 05/18/19 0433  BP: 101/65 110/76  Pulse: 72 66  Resp: 18 18  Temp: 37.3 C 36.8 C    Last Pain:  Vitals:   05/18/19 0700  TempSrc:   PainSc: Asleep   Pain Goal:                   [employer]

## 2019-05-19 MED ORDER — ACETAMINOPHEN 325 MG PO TABS: 650.0000 mg | ORAL_TABLET | ORAL | 0 refills | Status: DC | PRN

## 2019-05-19 MED ORDER — IBUPROFEN 600 MG PO TABS: 600.0000 mg | ORAL_TABLET | Freq: Four times a day (QID) | ORAL | 0 refills | Status: DC

## 2019-05-19 NOTE — Lactation Note (Signed)
This note was copied from a baby's chart. Lactation Consultation Note Baby 40 hrs old. Cluster feeding. Fussy. Mom was given #20 NS, c/o pain. Fitted mom #24 NS, stated much better. Mom has short shaft nipples. Breast full feeling. Rt. Breast softer than Lt. Breast. Mom stated baby BF on Rt. Breast last. Assisted in football position, placed pillows for support.  Taught application of #24 NS. Gave to mom for mom to demonstrated application. Encouraged "C" hold for latching. Newborn feeding habits and behavior discussed. Baby BF for 10 min. W/much stimulation then slept. Mom wanting to hold nipple in mouth and sleep. When removed cries. Mom discouraged and tired. Mom asking about supplementing and pumping. Mom only getting a little colostrum on flange when pumped. Drops when hand express. Mom has been giving to baby w/finger. Discussed I&O and STS. When mom finished BF she removed NS, saw thick drop of colostrum.  Gave mom shells to wear in am w/bra to evert nipples more. Mom stated she was tired. Encouraged FOB to hold baby and mom rest between feedings.  Patient Name: Caroline Mack WVPXT'G Date: 05/19/2019 Reason for consult: Mother's request;Difficult latch;Primapara;Term   Maternal Data    Feeding Feeding Type: Breast Fed  LATCH Score Latch: Grasps breast easily, tongue down, lips flanged, rhythmical sucking.  Audible Swallowing: A few with stimulation  Type of Nipple: Everted at rest and after stimulation(short shaft)  Comfort (Breast/Nipple): Filling, red/small blisters or bruises, mild/mod discomfort(sore/tender)  Hold (Positioning): Assistance needed to correctly position infant at breast and maintain latch.  LATCH Score: 7  Interventions Interventions: Breast feeding basics reviewed;Support pillows;Assisted with latch;Position options;Skin to skin;Breast massage;Hand express;Shells;Breast compression;Adjust position;Coconut oil  Lactation Tools  Discussed/Used Tools: Shells;Pump;Nipple Shields;Coconut oil Nipple shield size: 24 Shell Type: Inverted Breast pump type: Manual   Consult Status Consult Status: Follow-up Date: 05/19/19 Follow-up type: In-patient    Charyl Dancer 05/19/2019, 12:34 AM

## 2019-05-19 NOTE — Lactation Note (Signed)
This note was copied from a baby's chart. Lactation Consultation Note  Patient Name: Caroline Mack IPJAS'N Date: 05/19/2019 Reason for consult: Difficult latch;Nipple pain/trauma   P1, Baby 38 hours old.  Mother's nipples are tender.  Slight abrasion on tip of Rt nipple. She has comfort gels and shells.  6.7% weight loss.  3 voids and 6 stools in the last 24 hours.   Mother is using #24NS but was willing to latch without NS. Baby sustained latch for a few minutes and slipped down to tip of nipple. Had mother bring baby deeper on breast and mother states it feels more comfortable. Encouraged mother to continue pumping and giving volume back to baby. Noted baby has cupped tongue but did not observe tongue protrusion. Parents aware to watch for tongue protrusion. Discussed sitting upright when pumping and frequency.   Put in message for outpatient appointment. Mother has started supplementing with formula this morning because baby was feeding frequently last night.  Provided education regarding cluster feeding.     Maternal Data    Feeding Feeding Type: Breast Fed  LATCH Score Latch: Repeated attempts needed to sustain latch, nipple held in mouth throughout feeding, stimulation needed to elicit sucking reflex.  Audible Swallowing: A few with stimulation  Type of Nipple: Everted at rest and after stimulation  Comfort (Breast/Nipple): Filling, red/small blisters or bruises, mild/mod discomfort  Hold (Positioning): No assistance needed to correctly position infant at breast.  LATCH Score: 7  Interventions Interventions: Breast feeding basics reviewed;Comfort gels  Lactation Tools Discussed/Used     Consult Status Consult Status: Complete Date: 05/19/19    Dahlia Byes [facility] 05/19/2019, 11:22 AM

## 2019-06-24 ENCOUNTER — Ambulatory Visit: Payer: No Typology Code available for payment source | Admitting: Family Medicine

## 2019-06-30 ENCOUNTER — Encounter: Payer: Self-pay | Admitting: Obstetrics & Gynecology

## 2019-06-30 ENCOUNTER — Other Ambulatory Visit: Payer: Self-pay

## 2019-06-30 ENCOUNTER — Ambulatory Visit (INDEPENDENT_AMBULATORY_CARE_PROVIDER_SITE_OTHER): Payer: No Typology Code available for payment source | Admitting: Obstetrics & Gynecology

## 2019-06-30 VITALS — BP 120/71 | HR 75 | Ht 66.0 in | Wt 135.0 lb

## 2019-06-30 DIAGNOSIS — Z1389 Encounter for screening for other disorder: Secondary | ICD-10-CM

## 2019-06-30 DIAGNOSIS — Z30430 Encounter for insertion of intrauterine contraceptive device: Secondary | ICD-10-CM

## 2019-06-30 MED ORDER — LEVONORGESTREL 19.5 MCG/DAY IU IUD
INTRAUTERINE_SYSTEM | Freq: Once | INTRAUTERINE | Status: AC
  Administered 2019-06-30: 1 via INTRAUTERINE

## 2019-06-30 NOTE — Patient Instructions (Signed)

## 2019-06-30 NOTE — Progress Notes (Signed)
Post Partum Exam  Caroline Mack is a 29 y.o. G62P1001 female who presents for a postpartum visit. She is 6 weeks postpartum following a spontaneous vaginal delivery. I have fully reviewed the prenatal and intrapartum course. The delivery was at 40 gestational weeks.  Anesthesia: epidural. Postpartum course has been uneventful. Baby's course has been uneventful. Baby is feeding by breast. Bleeding thin lochia. Bowel function is normal. Bladder function is normal. Patient is not sexually active. Contraception method is IUD. Postpartum depression screening:neg (score 4)  Last pap smear done  09-29-2019 and was Normal  Review of Systems Pertinent items are noted in HPI.    Objective:  Last menstrual period 08/10/2018, unknown if currently breastfeeding. BP 120/71   Pulse 75   Ht 5\' 6"  (1.676 m)   Wt 135 lb (61.2 kg)   LMP 08/10/2018   BMI 21.79 kg/m   CONSTITUTIONAL: Well-developed, well-nourished female in no acute distress.  HENT:  Normocephalic, atraumatic EYES: Conjunctivae and EOM are normal. No scleral icterus.  NECK: Normal range of motion SKIN: Skin is warm and dry. No rash noted. Not diaphoretic.No pallor. NEUROLGIC: Alert and oriented to person, place, and time. Normal coordination.  GU: EGBUS: no lesions Vagina: no blood in vault Cervix: no lesion; no mucopurulent d/c  IUD Insertion Procedure Note Patient identified, informed consent performed.  Discussed risks of irregular bleeding, cramping, infection, malpositioning or misplacement of the IUD outside the uterus which may require further procedures. Time out was performed.  Urine pregnancy test negative.  Speculum placed in the vagina.  Cervix visualized.  Cleaned with Betadine x 2.  Grasped anteriorly with a single tooth tenaculum.  Uterus sounded to 9cm cm.  Lilieet IUD placed per manufacturer's recommendations.  Strings trimmed to 3 cm. Tenaculum was removed, good hemostasis noted.  Patient tolerated procedure well.     Assessment:    6 weeks postpartum exam. Pap smear not done at today's visit.  Contraception counseling. Pt opts for LnIUD. All of her questions were answered.    Plan:   1. Contraception: IUD 2.Patient was given post-procedure instructions.  Patient was asked to follow up in 4 weeks for IUD check. 3. Follow up sooner as needed.  Clance Baquero L. Harraway-Smith, M.D., 08/12/2018

## 2019-07-15 ENCOUNTER — Ambulatory Visit: Payer: No Typology Code available for payment source | Admitting: Family Medicine

## 2019-07-26 ENCOUNTER — Ambulatory Visit: Payer: No Typology Code available for payment source | Admitting: Obstetrics & Gynecology

## 2019-08-06 ENCOUNTER — Encounter: Payer: Self-pay | Admitting: Family Medicine

## 2019-08-06 ENCOUNTER — Ambulatory Visit (INDEPENDENT_AMBULATORY_CARE_PROVIDER_SITE_OTHER): Payer: No Typology Code available for payment source | Admitting: Family Medicine

## 2019-08-06 VITALS — BP 125/77 | HR 76 | Wt 136.0 lb

## 2019-08-06 DIAGNOSIS — Z30431 Encounter for routine checking of intrauterine contraceptive device: Secondary | ICD-10-CM | POA: Diagnosis not present

## 2019-08-06 NOTE — Progress Notes (Signed)
   Subjective:   Patient Name: Caroline Mack, female   DOB: 1990/05/22, 29 y.o.  MRN: [id number]  HPI Patient here for an IUD check.  She had the Liletta IUD placed 1 month ago.  She reports intermittent bleeding.   Review of Systems  Constitutional: Negative for fever and chills.  Gastrointestinal: Negative for abdominal pain.  Genitourinary: Negative for vaginal discharge, vaginal pain, pelvic pain and dyspareunia.        Objective:   Physical Exam  Constitutional: She appears well-developed and well-nourished.  HENT:  Head: Normocephalic and atraumatic.  Abdominal: Soft. There is no tenderness. There is no guarding.  Genitourinary: There is no rash, tenderness or lesion on the right labia. There is no rash, tenderness or lesion on the left labia. No erythema or tenderness in the vagina. No foreign body around the vagina. No signs of injury around the vagina. No vaginal discharge found.    Skin: Skin is warm and dry.  Psychiatric: She has a normal mood and affect. Her behavior is normal. Judgment and thought content normal.       Assessment & Plan:  1. IUD check up IUD in place. Discussed that breakthrough bleeding common the first month. Currently not bleeding. If starts to have irregular bleeding, pt to call and will prescribe additional oral progesterone. Pt to call with any other problems.  Recheck in 3 months.

## 2019-08-06 NOTE — Progress Notes (Signed)
String check. Patient is thinking about removal. Patient has had bleeding on and off since insertion. Armandina Stammer RN

## 2020-02-21 ENCOUNTER — Other Ambulatory Visit: Payer: Self-pay

## 2020-02-21 ENCOUNTER — Ambulatory Visit: Payer: No Typology Code available for payment source | Admitting: Family Medicine

## 2020-02-21 ENCOUNTER — Encounter: Payer: Self-pay | Admitting: Family Medicine

## 2020-02-21 ENCOUNTER — Ambulatory Visit (INDEPENDENT_AMBULATORY_CARE_PROVIDER_SITE_OTHER): Payer: No Typology Code available for payment source

## 2020-02-21 VITALS — BP 110/80 | HR 74 | Ht 66.0 in | Wt 131.0 lb

## 2020-02-21 DIAGNOSIS — G8929 Other chronic pain: Secondary | ICD-10-CM

## 2020-02-21 DIAGNOSIS — M533 Sacrococcygeal disorders, not elsewhere classified: Secondary | ICD-10-CM | POA: Diagnosis not present

## 2020-02-21 DIAGNOSIS — M999 Biomechanical lesion, unspecified: Secondary | ICD-10-CM

## 2020-02-21 NOTE — Progress Notes (Signed)
[facility] [address] [place] [address] Phone: [phone] Subjective:   I Caroline Mack am serving as a Neurosurgeon for Dr. Antoine Primas.  This visit occurred during the SARS-CoV-2 public health emergency.  Safety protocols were in place, including screening questions prior to the visit, additional usage of staff PPE, and extensive cleaning of exam room while observing appropriate contact time as indicated for disinfecting solutions.   I'm seeing this patient by the request  of:  Shirlean Mylar, MD  CC: Low back pain and hip pain  CHE:NIDPOEUMPN  Caroline Mack is a 29 y.o. female coming in with complaint of left hip and groin pain. Patient states her pain is chronic. Pain with running or walking long distances. Had a baby at the end of January. SI joint on the left side is most painful. Pain radiates to the glut and groin. 5/10 at its worse. Some stiffness in the back.  Patient did take 2 months off from running but did not notice any significant improvement.      Past Medical History:  Diagnosis Date  . Medical history non-contributory    Past Surgical History:  Procedure Laterality Date  . ear tumor Left    Social History   Socioeconomic History  . Marital status: Married    Spouse name: Not on file  . Number of children: Not on file  . Years of education: Not on file  . Highest education level: Not on file  Occupational History  . Not on file  Tobacco Use  . Smoking status: Never Smoker  . Smokeless tobacco: Never Used  Vaping Use  . Vaping Use: Never used  Substance and Sexual Activity  . Alcohol use: Never  . Drug use: Never  . Sexual activity: Yes  Other Topics Concern  . Not on file  Social History Narrative  . Not on file   Social Determinants of Health   Financial Resource Strain:   . Difficulty of Paying Living Expenses: Not on file  Food Insecurity:   . Worried About Programme researcher, broadcasting/film/video in the Last Year: Not on  file  . Ran Out of Food in the Last Year: Not on file  Transportation Needs:   . Lack of Transportation (Medical): Not on file  . Lack of Transportation (Non-Medical): Not on file  Physical Activity:   . Days of Exercise per Week: Not on file  . Minutes of Exercise per Session: Not on file  Stress:   . Feeling of Stress : Not on file  Social Connections:   . Frequency of Communication with Friends and Family: Not on file  . Frequency of Social Gatherings with Friends and Family: Not on file  . Attends Religious Services: Not on file  . Active Member of Clubs or Organizations: Not on file  . Attends Banker Meetings: Not on file  . Marital Status: Not on file   No Known Allergies Family History  Problem Relation Age of Onset  . Diabetes Maternal Grandfather   . Cancer Paternal Grandfather   . Hypertension Neg Hx    No current outpatient medications on file.   Reviewed prior external information including notes and imaging from  primary care provider As well as notes that were available from care everywhere and other healthcare systems.  Past medical history, social, surgical and family history all reviewed in electronic medical record.  No pertanent information unless stated regarding to the chief complaint.  Review of Systems:  No headache, visual changes, nausea, vomiting, diarrhea, constipation, dizziness, abdominal pain, skin rash, fevers, chills, night sweats, weight loss, swollen lymph nodes, body aches, joint swelling, chest pain, shortness of breath, mood changes. POSITIVE muscle aches  Objective  Blood pressure 110/80, pulse 74, height 5\' 6"  (1.676 m), weight 131 lb (59.4 kg), SpO2 98 %, currently breastfeeding.   General: No apparent distress alert and oriented x3 mood and affect normal, dressed appropriately.  HEENT: Pupils equal, extraocular movements intact  Respiratory: Patient's speak in full sentences and does not appear short of breath   Cardiovascular: No lower extremity edema, non tender, no erythema  Neuro: Cranial nerves II through XII are intact, neurovascularly intact in all extremities with 2+ DTRs and 2+ pulses.  Gait normal with good balance and coordination.  MSK: Left hip exam shows that patient has some very mild pain with external rotation more than internal rotation.  Negative grind test.  Patient does have 5 out of 5 strength of lower extremities.  Moderate to severe tenderness noted over the left sacroiliac joint.  Mild positive FABER test.  Negative straight leg test.  Very mild loss of lordosis of the lumbar spine  Osteopathic findings   T5 extended rotated and side bent left L2 flexed rotated and side bent right Sacrum left on left     Impression and Recommendations:     The above documentation has been reviewed and is accurate and complete , DO

## 2020-02-21 NOTE — Assessment & Plan Note (Signed)
   Decision today to treat with OMT was based on Physical Exam  After verbal consent patient was treated with HVLA, ME, FPR techniques in  thoracic, lumbar and sacral areas, Patient tolerated the procedure well with improvement in symptoms  Patient given exercises, stretches and lifestyle modifications  See medications in patient instructions if given  Patient will follow up in 4-8 weeks 

## 2020-02-21 NOTE — Assessment & Plan Note (Signed)
Patient does have more of a sacroiliac dysfunction.  Discussed no pain at this time.  Discussed icing regimen and home exercise, discussed avoiding certain activities.  Patient did respond fairly well to osteopathic manipulation.  We discussed over-the-counter medications that could be beneficial.  Patient does have some signs and symptoms consistent with a potential pelvic floor.  The differential also includes a possible hip labral pathology x-rays pending at the moment.  Follow-up again in 4 to 6 weeks

## 2020-02-21 NOTE — Patient Instructions (Signed)
Good to see you Ice 20 mins 2 times a day SI joint exercises today Vitamin D 2,000 IUs See me again in 5-6 weeks we can consider PT for SI joint and pelvic floor

## 2020-02-22 ENCOUNTER — Encounter: Payer: Self-pay | Admitting: Family Medicine

## 2020-03-30 ENCOUNTER — Ambulatory Visit: Payer: No Typology Code available for payment source | Admitting: Family Medicine

## 2020-04-11 NOTE — Progress Notes (Signed)
[name] [address] [place] [address] Phone: [phone] Subjective:   I Caroline Mack am serving as a Neurosurgeon for Dr. Antoine Primas.  This visit occurred during the SARS-CoV-2 public health emergency.  Safety protocols were in place, including screening questions prior to the visit, additional usage of staff PPE, and extensive cleaning of exam room while observing appropriate contact time as indicated for disinfecting solutions.   I'm seeing this patient by the request  of:  Shirlean Mylar, MD  CC: Left lower back pain  [id number]  Caroline Mack is a 29 y.o. female coming in with complaint of SI joint pain. OMT 02/21/2020. Patient states she is doing a little better.  Patient did have back x-rays at last exam that did show the patient had a left-sided mild arthritic changes of the sacrum with potential sacroiliitis.  Patient is concerned about this.  Wants to know if this will be a chronic problem.  Patient states that she can do any exercises but running seems to be what causes more of the problems.         Reviewed prior external information including notes and imaging from previsou exam, outside providers and external EMR if available.   As well as notes that were available from care everywhere and other healthcare systems.  Past medical history, social, surgical and family history all reviewed in electronic medical record.  No pertanent information unless stated regarding to the chief complaint.   Past Medical History:  Diagnosis Date  . Medical history non-contributory     No Known Allergies   Review of Systems:  No headache, visual changes, nausea, vomiting, diarrhea, constipation, dizziness, abdominal pain, skin rash, fevers, chills, night sweats, weight loss, swollen lymph nodes, body aches, joint swelling, chest pain, shortness of breath, mood changes. POSITIVE muscle aches  Objective  Blood pressure 100/76, pulse 75,  height 5\' 6"  (1.676 m), weight 130 lb (59 kg), last menstrual period 03/29/2020, SpO2 98 %, currently breastfeeding.   General: No apparent distress alert and oriented x3 mood and affect normal, dressed appropriately.  HEENT: Pupils equal, extraocular movements intact  Respiratory: Patient's speak in full sentences and does not appear short of breath  Cardiovascular: No lower extremity edema, non tender, no erythema  Neuro: Cranial nerves II through XII are intact, neurovascularly intact in all extremities with 2+ DTRs and 2+ pulses.  Gait normal with good balance and coordination.  MSK:  Non tender with full range of motion and good stability and symmetric strength and tone of shoulders, elbows, wrist, hip, knee and ankles bilaterally.  Back -left-sided back mild discomfort more over the left sacroiliac joint.  Mild tightness of the FABER test on the left compared to the right.  Negative straight leg test.  Mild tenderness in the paraspinal musculature of the left side of the back  Osteopathic findings T9 extended rotated and side bent left L2 flexed rotated and side bent right Sacrum left on left       Assessment and Plan:  Chronic left sacroiliac pain Patient's x-rays of the left sacroiliac joint do have some moderate arthritic changes with potential sacroiliitis.  X-rays of the lumbar spine also ordered today.  Discussed with patient about laboratory work-up which will be ordered today.  Overall patient is only having pain with the running.  Discussed other over-the-counter medicines.  Discussed icing regimen.  Follow-up again in 6 weeks     Nonallopathic problems  Decision today to treat  with OMT was based on Physical Exam  After verbal consent patient was treated with HVLA, ME, FPR techniques in  thoracic, lumbar, and sacral  areas  Patient tolerated the procedure well with improvement in symptoms  Patient given exercises, stretches and lifestyle modifications  See  medications in patient instructions if given  Patient will follow up in 4-8 weeks      The above documentation has been reviewed and is accurate and complete Judi Saa, DO       Note: This dictation was prepared with Dragon dictation along with smaller phrase technology. Any transcriptional errors that result from this process are unintentional.

## 2020-04-12 ENCOUNTER — Ambulatory Visit (INDEPENDENT_AMBULATORY_CARE_PROVIDER_SITE_OTHER): Payer: No Typology Code available for payment source

## 2020-04-12 ENCOUNTER — Ambulatory Visit: Payer: No Typology Code available for payment source | Admitting: Family Medicine

## 2020-04-12 ENCOUNTER — Other Ambulatory Visit: Payer: Self-pay

## 2020-04-12 ENCOUNTER — Encounter: Payer: Self-pay | Admitting: Family Medicine

## 2020-04-12 VITALS — BP 100/76 | HR 75 | Ht 66.0 in | Wt 130.0 lb

## 2020-04-12 DIAGNOSIS — G8929 Other chronic pain: Secondary | ICD-10-CM

## 2020-04-12 DIAGNOSIS — M545 Low back pain, unspecified: Secondary | ICD-10-CM | POA: Diagnosis not present

## 2020-04-12 DIAGNOSIS — M533 Sacrococcygeal disorders, not elsewhere classified: Secondary | ICD-10-CM

## 2020-04-12 DIAGNOSIS — M2550 Pain in unspecified joint: Secondary | ICD-10-CM

## 2020-04-12 DIAGNOSIS — M999 Biomechanical lesion, unspecified: Secondary | ICD-10-CM

## 2020-04-12 LAB — COMPREHENSIVE METABOLIC PANEL
ALT: 14 U/L (ref 0–35)
AST: 16 U/L (ref 0–37)
Albumin: 4.5 g/dL (ref 3.5–5.2)
Alkaline Phosphatase: 54 U/L (ref 39–117)
BUN: 19 mg/dL (ref 6–23)
CO2: 28 mEq/L (ref 19–32)
Calcium: 9.6 mg/dL (ref 8.4–10.5)
Chloride: 104 mEq/L (ref 96–112)
Creatinine, Ser: 0.88 mg/dL (ref 0.40–1.20)
GFR: 87.72 mL/min (ref >60.00)
Glucose, Bld: 101 mg/dL — ABNORMAL HIGH (ref 70–99)
Potassium: 3.9 mEq/L (ref 3.5–5.1)
Sodium: 138 mEq/L (ref 135–145)
Total Bilirubin: 0.7 mg/dL (ref 0.2–1.2)
Total Protein: 7.6 g/dL (ref 6.0–8.3)

## 2020-04-12 LAB — CBC WITH DIFFERENTIAL/PLATELET
Basophils Absolute: 0 10*3/uL (ref 0.0–0.1)
Basophils Relative: 0.7 % (ref 0.0–3.0)
Eosinophils Absolute: 0.1 10*3/uL (ref 0.0–0.7)
Eosinophils Relative: 2 % (ref 0.0–5.0)
HCT: 42.3 % (ref 36.0–46.0)
Hemoglobin: 14.3 g/dL (ref 12.0–15.0)
Lymphocytes Relative: 40 % (ref 12.0–46.0)
Lymphs Abs: 2.1 10*3/uL (ref 0.7–4.0)
MCHC: 33.9 g/dL (ref 30.0–36.0)
MCV: 92.7 fl (ref 78.0–100.0)
Monocytes Absolute: 0.5 10*3/uL (ref 0.1–1.0)
Monocytes Relative: 8.8 % (ref 3.0–12.0)
Neutro Abs: 2.6 10*3/uL (ref 1.4–7.7)
Neutrophils Relative %: 48.5 % (ref 43.0–77.0)
Platelets: 285 10*3/uL (ref 150.0–400.0)
RBC: 4.56 Mil/uL (ref 3.87–5.11)
RDW: 12.6 % (ref 11.5–15.5)
WBC: 5.3 10*3/uL (ref 4.0–10.5)

## 2020-04-12 LAB — IBC PANEL
Iron: 85 ug/dL (ref 42–145)
Saturation Ratios: 22.3 % (ref 20.0–50.0)
Transferrin: 272 mg/dL (ref 212.0–360.0)

## 2020-04-12 LAB — URIC ACID: Uric Acid, Serum: 4.3 mg/dL (ref 2.4–7.0)

## 2020-04-12 LAB — FERRITIN: Ferritin: 22.4 ng/mL (ref 10.0–291.0)

## 2020-04-12 LAB — VITAMIN D 25 HYDROXY (VIT D DEFICIENCY, FRACTURES): VITD: 38.12 ng/mL (ref 30.00–100.00)

## 2020-04-12 LAB — SEDIMENTATION RATE: Sed Rate: 6 mm/hr (ref 0–20)

## 2020-04-12 NOTE — Patient Instructions (Addendum)
Good to see you Lumbar xray Labs today Tumeric 500 mg daily Continue vitamin D  See me again in 6 weeks

## 2020-04-12 NOTE — Assessment & Plan Note (Signed)
Patient's x-rays of the left sacroiliac joint do have some moderate arthritic changes with potential sacroiliitis.  X-rays of the lumbar spine also ordered today.  Discussed with patient about laboratory work-up which will be ordered today.  Overall patient is only having pain with the running.  Discussed other over-the-counter medicines.  Discussed icing regimen.  Follow-up again in 6 weeks

## 2020-04-17 LAB — HLA-B27 ANTIGEN: HLA-B27 Antigen: NEGATIVE

## 2020-04-17 LAB — ANA: Anti Nuclear Antibody (ANA): NEGATIVE

## 2020-05-05 ENCOUNTER — Encounter: Payer: Self-pay | Admitting: Obstetrics & Gynecology

## 2020-05-05 ENCOUNTER — Ambulatory Visit: Payer: No Typology Code available for payment source | Admitting: Obstetrics & Gynecology

## 2020-05-05 ENCOUNTER — Other Ambulatory Visit: Payer: Self-pay

## 2020-05-05 VITALS — BP 111/64 | HR 71 | Wt 129.1 lb

## 2020-05-05 DIAGNOSIS — Z30432 Encounter for removal of intrauterine contraceptive device: Secondary | ICD-10-CM

## 2020-05-05 DIAGNOSIS — E785 Hyperlipidemia, unspecified: Secondary | ICD-10-CM | POA: Insufficient documentation

## 2020-05-05 NOTE — Progress Notes (Signed)
30 y.o. G60P1001 Married Caucasian female presents for removal of mirena IUD.  She's had it for a year and just does not like the irregular bleeding she's had with it.  She's already restarted OCPs that Dr. Hyman Hopes wrote for her.  She was on these before her pregnancy.  Never had any issues with them.  Side effects reviewed.  She is interested in taking ocntinuous active pills.  We discussed how she should do this.  Was given 4 packs with 3 month supply so should have enough active pills.  Reminded pt what to do if misses a pill and/or starts pack late.  Pt has also been counseled about risks and benefits as well as complications.  Consent is obtained today.  All questions answered prior to start of procedure.   Patient Active Problem List   Diagnosis Date Noted  . Chronic left sacroiliac pain 02/21/2020  . Nonallopathic lesion of sacral region 02/21/2020  . Oligohydramnios 05/17/2019  . Oligohydramnios antepartum 05/12/2019  . Size of fetus inconsistent with dates in third trimester 05/11/2019  . Encounter for supervision of normal first pregnancy in first trimester 09/29/2018   Past Medical History:  Diagnosis Date  . Medical history non-contributory    Current Outpatient Medications on File Prior to Visit  Medication Sig Dispense Refill  . norethindrone-ethinyl estradiol (LOESTRIN) 1-20 MG-MCG tablet Take 1 tablet by mouth daily.     No current facility-administered medications on file prior to visit.   Patient has no known allergies.  Review of Systems  Constitutional: Negative.   Gastrointestinal: Negative.   Genitourinary: Negative.    Vitals:   05/05/20 0855  BP: 111/64  Pulse: 71  Weight: 129 lb 1.3 oz (58.6 kg)    Gen:  WNWF healthy female NAD Abdomen: soft, non-tender Groin:  no inguinal nodes palpated  Pelvic exam: Vulva:  normal female genitalia Vagina:  normal vagina Cervix:  Non-tender, Negative CMT, no lesions or redness.  IUD string noted. Uterus:  normal  shape, position and consistency   Procedure:  Speculum placed.  IUD string noted and grasped with ringed forcep.  With one pull, IUD removed easily.  Pt tolerated this well.  Assessment/Plan: 1. Encounter for IUD removal - IUD removed without difficulty. - Pt will continue with taking Loestrin 1/20.  Does not need Rx. - Next pap smear due after 09/2021.

## 2020-05-18 NOTE — Progress Notes (Deleted)
  [name] [address] [place] [address] Phone: [phone] Subjective:    I'm seeing this patient by the request  of:  Shirlean Mylar, MD  CC:   [id number]  Caroline Mack is a 30 y.o. female coming in with complaint of back and neck pain. OMT 04/12/2020. Patient states   Medications patient has been prescribed: None   Taking:         Reviewed prior external information including notes and imaging from previsou exam, outside providers and external EMR if available.   As well as notes that were available from care everywhere and other healthcare systems.  Past medical history, social, surgical and family history all reviewed in electronic medical record.  No pertanent information unless stated regarding to the chief complaint.   Past Medical History:  Diagnosis Date  . Medical history non-contributory     No Known Allergies   Review of Systems:  No headache, visual changes, nausea, vomiting, diarrhea, constipation, dizziness, abdominal pain, skin rash, fevers, chills, night sweats, weight loss, swollen lymph nodes, body aches, joint swelling, chest pain, shortness of breath, mood changes. POSITIVE muscle aches  Objective  currently breastfeeding.   General: No apparent distress alert and oriented x3 mood and affect normal, dressed appropriately.  HEENT: Pupils equal, extraocular movements intact  Respiratory: Patient's speak in full sentences and does not appear short of breath  Cardiovascular: No lower extremity edema, non tender, no erythema  Neuro: Cranial nerves II through XII are intact, neurovascularly intact in all extremities with 2+ DTRs and 2+ pulses.  Gait normal with good balance and coordination.  MSK:  Non tender with full range of motion and good stability and symmetric strength and tone of shoulders, elbows, wrist, hip, knee and ankles bilaterally.  Back - Normal skin, Spine with normal alignment and no  deformity.  No tenderness to vertebral process palpation.  Paraspinous muscles are not tender and without spasm.   Range of motion is full at neck and lumbar sacral regions  Osteopathic findings  C2 flexed rotated and side bent right C6 flexed rotated and side bent left T3 extended rotated and side bent right inhaled rib T9 extended rotated and side bent left L2 flexed rotated and side bent right Sacrum right on right       Assessment and Plan:    Nonallopathic problems  Decision today to treat with OMT was based on Physical Exam  After verbal consent patient was treated with HVLA, ME, FPR techniques in cervical, rib, thoracic, lumbar, and sacral  areas  Patient tolerated the procedure well with improvement in symptoms  Patient given exercises, stretches and lifestyle modifications  See medications in patient instructions if given  Patient will follow up in 4-8 weeks      The above documentation has been reviewed and is accurate and complete Wilford Grist       Note: This dictation was prepared with Dragon dictation along with smaller phrase technology. Any transcriptional errors that result from this process are unintentional.

## 2020-05-23 ENCOUNTER — Ambulatory Visit: Payer: No Typology Code available for payment source | Admitting: Family Medicine

## 2020-10-26 ENCOUNTER — Other Ambulatory Visit: Payer: Self-pay | Admitting: Family Medicine

## 2020-10-26 DIAGNOSIS — N6320 Unspecified lump in the left breast, unspecified quadrant: Secondary | ICD-10-CM

## 2020-11-07 ENCOUNTER — Other Ambulatory Visit: Payer: Self-pay | Admitting: Family Medicine

## 2020-11-07 DIAGNOSIS — N6320 Unspecified lump in the left breast, unspecified quadrant: Secondary | ICD-10-CM

## 2020-11-08 ENCOUNTER — Other Ambulatory Visit: Payer: Self-pay

## 2020-11-08 ENCOUNTER — Ambulatory Visit
Admission: RE | Admit: 2020-11-08 | Discharge: 2020-11-08 | Disposition: A | Discharge status: Home / Self Care | Payer: No Typology Code available for payment source | Source: Ambulatory Visit | Attending: Family Medicine | Admitting: Family Medicine

## 2020-11-08 ENCOUNTER — Ambulatory Visit: Payer: No Typology Code available for payment source

## 2020-11-08 DIAGNOSIS — N6320 Unspecified lump in the left breast, unspecified quadrant: Secondary | ICD-10-CM

## 2020-11-09 ENCOUNTER — Other Ambulatory Visit: Payer: Self-pay | Admitting: Family Medicine

## 2020-11-09 DIAGNOSIS — N6320 Unspecified lump in the left breast, unspecified quadrant: Secondary | ICD-10-CM

## 2020-11-10 ENCOUNTER — Other Ambulatory Visit: Payer: No Typology Code available for payment source

## 2020-11-10 ENCOUNTER — Other Ambulatory Visit: Payer: Self-pay | Admitting: Family Medicine

## 2020-11-10 DIAGNOSIS — N6320 Unspecified lump in the left breast, unspecified quadrant: Secondary | ICD-10-CM

## 2020-12-14 IMAGING — DX DG HIP (WITH OR WITHOUT PELVIS) 2-3V*L*
3 series · 3 of 3 positions shown · non-contrast
Comparison: None.

CLINICAL DATA: Left hip and SI joint pain for 4 months.

EXAM:
DG HIP (WITH OR WITHOUT PELVIS) 2-3V LEFT

[pelvis ap]
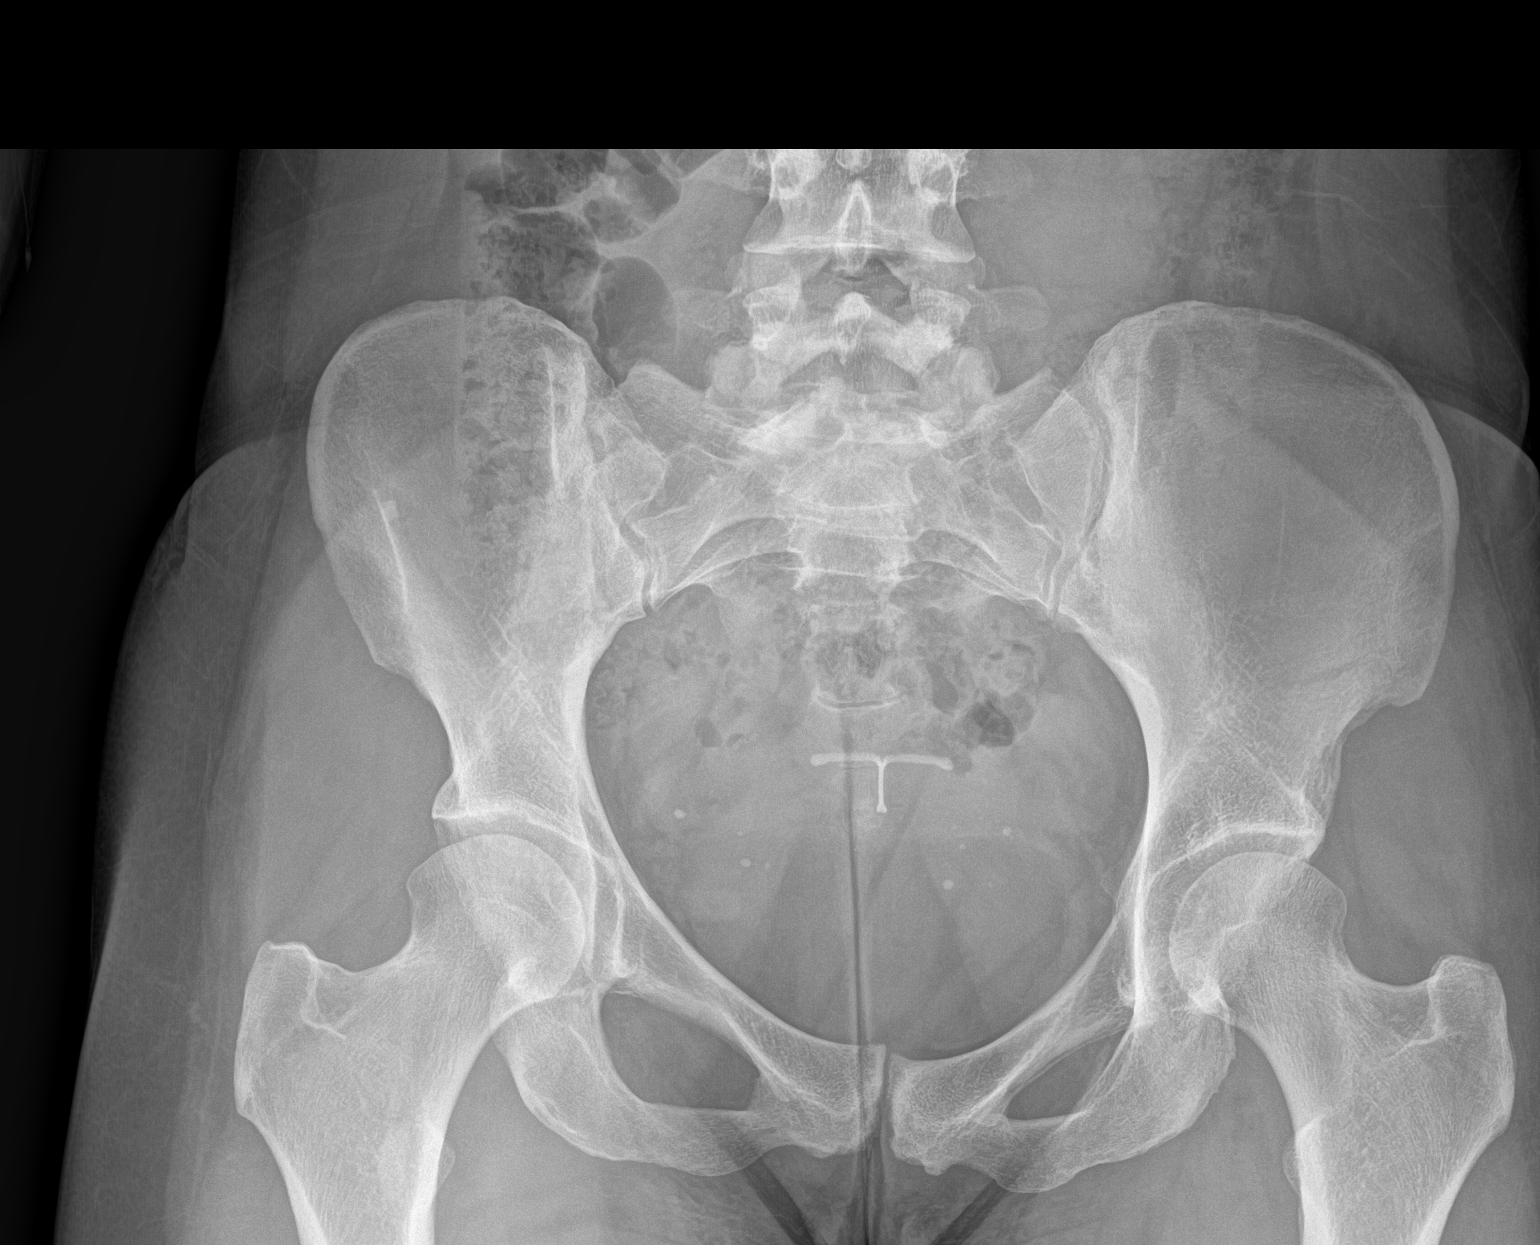

[hip ap]
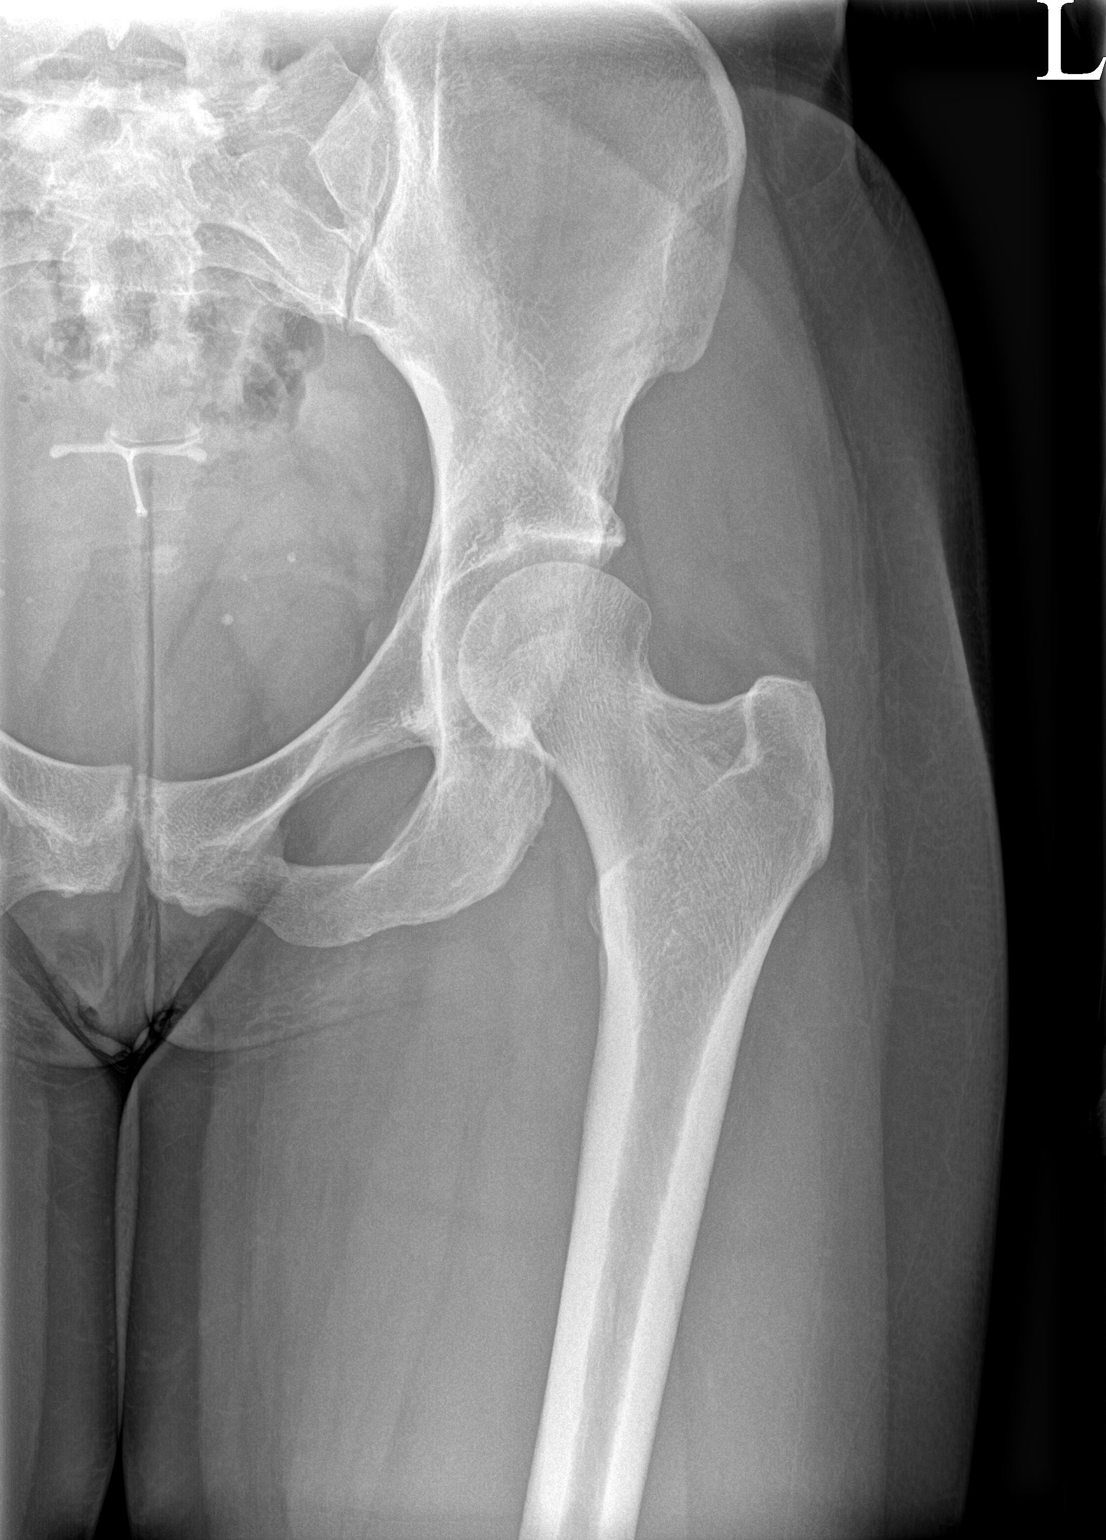

[hip frog leg]
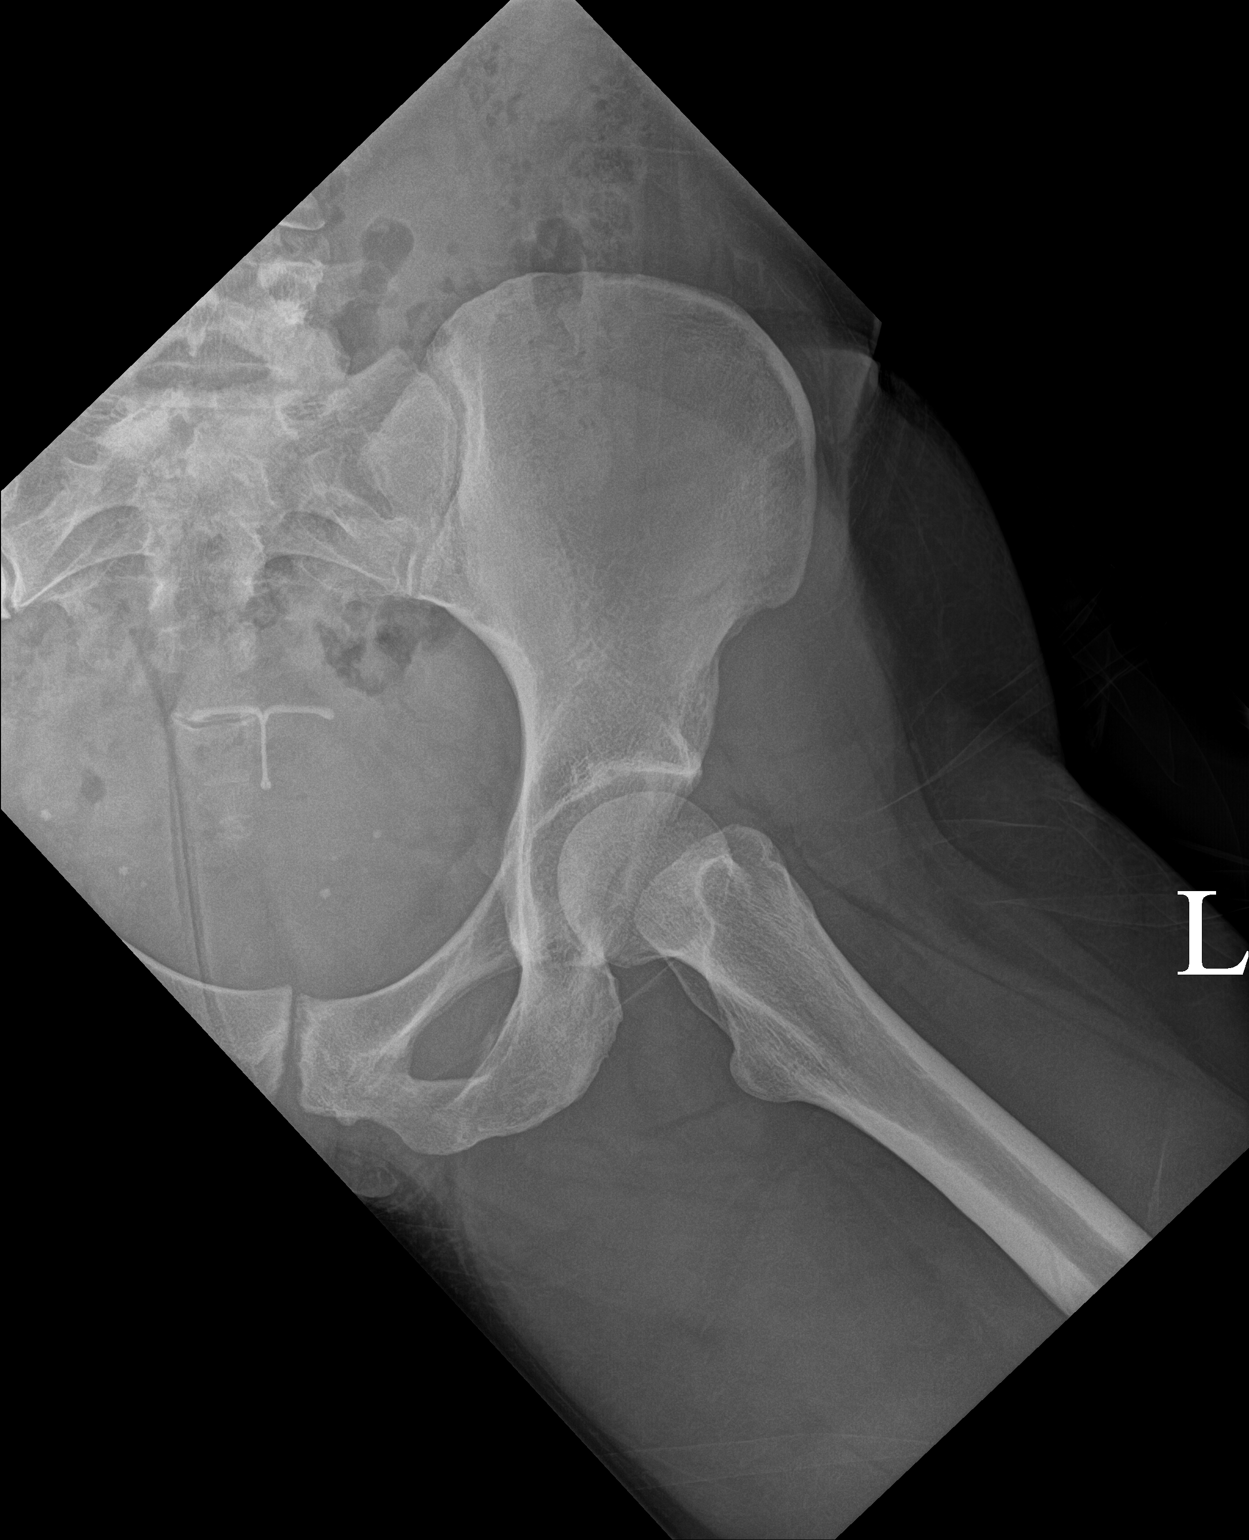

[3 of 3 positions shown; findings below may reference images not displayed]

FINDINGS: Cortical margins of the pelvis and left hip are intact. Hip is
seated in the acetabulum with preservation of joint space. No
evidence of fracture, avascular necrosis, or focal bone lesion.
Minor sclerosis on the iliac side of the left sacroiliac joint. No
erosions. IUD in the pelvis.
IMPRESSION: Mild sclerosis on the iliac side of the left sacroiliac joint, may
represent mild sacroiliitis. No evidence of inflammatory
arthropathy. Unremarkable radiographic appearance of the left hip.

## 2021-04-05 ENCOUNTER — Other Ambulatory Visit (HOSPITAL_COMMUNITY)
Admission: RE | Admit: 2021-04-05 | Discharge: 2021-04-05 | Disposition: A | Discharge status: Home / Self Care | Payer: No Typology Code available for payment source | Source: Ambulatory Visit | Attending: Family Medicine | Admitting: Family Medicine

## 2021-04-05 ENCOUNTER — Encounter: Payer: Self-pay | Admitting: Family Medicine

## 2021-04-05 ENCOUNTER — Other Ambulatory Visit: Payer: Self-pay

## 2021-04-05 ENCOUNTER — Ambulatory Visit (INDEPENDENT_AMBULATORY_CARE_PROVIDER_SITE_OTHER): Payer: No Typology Code available for payment source | Admitting: Family Medicine

## 2021-04-05 VITALS — BP 113/74 | HR 71 | Wt 129.0 lb

## 2021-04-05 DIAGNOSIS — Z3490 Encounter for supervision of normal pregnancy, unspecified, unspecified trimester: Secondary | ICD-10-CM | POA: Diagnosis not present

## 2021-04-05 DIAGNOSIS — Z3480 Encounter for supervision of other normal pregnancy, unspecified trimester: Secondary | ICD-10-CM | POA: Insufficient documentation

## 2021-04-05 DIAGNOSIS — E538 Deficiency of other specified B group vitamins: Secondary | ICD-10-CM

## 2021-04-05 DIAGNOSIS — Z3481 Encounter for supervision of other normal pregnancy, first trimester: Secondary | ICD-10-CM | POA: Diagnosis not present

## 2021-04-05 DIAGNOSIS — Z3A10 10 weeks gestation of pregnancy: Secondary | ICD-10-CM | POA: Diagnosis not present

## 2021-04-05 NOTE — Progress Notes (Addendum)
Subjective:  Caroline Mack is a G2P1001 [redacted]w[redacted]d being seen today for her first obstetrical visit. Prior SVD without complications. No medical complications. Patient does intend to breast feed. Pregnancy history fully reviewed.  Patient reports being treated for B12 deficiency earlier in the year. Has stopped extra supplementation at this point, but has not had B12 rechecked.  BP 113/74    Pulse 71    Wt 129 lb (58.5 kg)    LMP 01/20/2021    Breastfeeding Unknown    BMI 20.82 kg/m   HISTORY: OB History  Gravida Para Term Preterm AB Living  2 1 1     1   SAB IAB Ectopic Multiple Live Births        0 1    # Outcome Date GA Lbr Len/2nd Weight Sex Delivery Anes PTL Lv  2 Current           1 Term 05/17/19 [redacted]w[redacted]d 06:00 / 01:04 7 lb 0.9 oz (3.201 kg) F Vag-Spont EPI  LIV     Birth Comments: wnl    Past Medical History:  Diagnosis Date   Medical history non-contributory     Past Surgical History:  Procedure Laterality Date   ear tumor Left     Family History  Problem Relation Age of Onset   Diabetes Maternal Grandfather    Cancer Paternal Grandfather    Hypertension Neg Hx      Exam  BP 113/74    Pulse 71    Wt 129 lb (58.5 kg)    LMP 01/20/2021    Breastfeeding Unknown    BMI 20.82 kg/m   Chaperone present during exam  CONSTITUTIONAL: Well-developed, well-nourished female in no acute distress.  HENT:  Normocephalic, atraumatic, External right and left ear normal. Oropharynx is clear and moist EYES: Conjunctivae and EOM are normal. Pupils are equal, round, and reactive to light. No scleral icterus.  NECK: Normal range of motion, supple, no masses.  Normal thyroid.  CARDIOVASCULAR: Normal heart rate noted, regular rhythm RESPIRATORY: Clear to auscultation bilaterally. Effort and breath sounds normal, no problems with respiration noted. BREASTS: Symmetric in size. No masses, skin changes, nipple drainage, or lymphadenopathy. ABDOMEN: Soft, normal bowel sounds, no distention  noted.  No tenderness, rebound or guarding.  PELVIC: Normal appearing external genitalia; normal appearing vaginal mucosa and cervix. No abnormal discharge noted. Normal uterine size, no other palpable masses, no uterine or adnexal tenderness. MUSCULOSKELETAL: Normal range of motion. No tenderness.  No cyanosis, clubbing, or edema.  2+ distal pulses. SKIN: Skin is warm and dry. No rash noted. Not diaphoretic. No erythema. No pallor. NEUROLOGIC: Alert and oriented to person, place, and time. Normal reflexes, muscle tone coordination. No cranial nerve deficit noted. PSYCHIATRIC: Normal mood and affect. Normal behavior. Normal judgment and thought content.    Assessment:    Pregnancy: G2P1001 Patient Active Problem List   Diagnosis Date Noted   Supervision of other normal pregnancy, antepartum 04/05/2021   Hyperlipidemia 05/05/2020      Plan:   1. Supervision of other normal pregnancy, antepartum  Initial labs obtained Continue prenatal vitamins Reviewed n/v relief measures and warning s/s to report Reviewed recommended weight gain based on pre-gravid BMI Encouraged well-balanced diet Genetic & carrier screening discussed: declines Panorama,  Ultrasound discussed; fetal survey: requested CCNC completed> form faxed if has or is planning to apply for medicaid The nature of [facility] for 05/07/2020 with multiple MDs and other Advanced Practice Providers was explained to patient;  also emphasized that fellows, residents, and students are part of our team. - CBC/D/Plt+RPR+Rh+ABO+RubIgG... - Culture, OB Urine - [place] MFM OB COMP + 14 WK; Future - [facility] AMB BABYSCRIPTS OPT IN - Cytology - PAP( [place]) - B12 and Folate Panel  2. Low serum vitamin B12 Recheck CBC and b12 levels. - B12 and Folate Panel    Indications for ASA therapy (per uptodate) One of the following: Previous pregnancy with preeclampsia, especially early onset and with an adverse outcome  No Multifetal gestation No Chronic hypertension No Type 1 or 2 diabetes mellitus No Chronic kidney disease No Autoimmune disease (antiphospholipid syndrome, systemic lupus erythematosus) No  Two or more of the following: Nulliparity No Obesity (body mass index >30 kg/m2) No Family history of preeclampsia in mother or sister No Age ?35 years No Sociodemographic characteristics (African American race, low socioeconomic level) No Personal risk factors (eg, previous pregnancy with low birth weight or small for gestational age infant, previous adverse pregnancy outcome [eg, stillbirth], interval >10 years between pregnancies) Yes  No supplementation of ASA required - will discuss at next appointment  Problem list reviewed and updated. 75% of 30 min visit spent on counseling and coordination of care.     Levie Heritage 05/31/2021

## 2021-04-05 NOTE — Progress Notes (Signed)
DATING AND VIABILITY SONOGRAM   Caroline Mack is a 30 y.o. year old G3P1001 with LMP Patient's last menstrual period was 01/20/2021. which would correlate to  [redacted]w[redacted]d weeks gestation.  She has regular menstrual cycles.   She is here today for a confirmatory initial sonogram.    GESTATION: SINGLETON  FETAL ACTIVITY:          Heart rate         188 bpm          The fetus is active.   ADNEXA: The ovaries are normal.   GESTATIONAL AGE AND  BIOMETRICS:  Gestational criteria: Estimated Date of Delivery: 10/27/21 by LMP now at [redacted]w[redacted]d  Previous Scans:0          CROWN RUMP LENGTH           4.28 cmm         11.1 weeks         4.18 cm 11.0 weeks                                                                           AVERAGE EGA(BY THIS SCAN):  11-0 weeks  WORKING EDD( LMP ):  10/27/2021     TECHNICIAN COMMENTS:  Patient informed that the ultrasound is considered a limited obstetric ultrasound and is not intended to be a complete ultrasound exam.  Patient also informed that the ultrasound is not being completed with the intent of assessing for fetal or placental anomalies or any pelvic abnormalities. Explained that the purpose of today's ultrasound is to assess for fetal heart rate.  Patient acknowledges the purpose of the exam and the limitations of the study.  Armandina Stammer 04/05/2021 11:48 AM

## 2021-04-06 LAB — CBC/D/PLT+RPR+RH+ABO+RUBIGG...
Antibody Screen: NEGATIVE
Basophils Absolute: 0 10*3/uL (ref 0.0–0.2)
Basos: 0 %
EOS (ABSOLUTE): 0.1 10*3/uL (ref 0.0–0.4)
Eos: 1 %
HCV Ab: 0.1 s/co ratio (ref 0.0–0.9)
HIV Screen 4th Generation wRfx: NONREACTIVE
Hematocrit: 36.8 % (ref 34.0–46.6)
Hemoglobin: 12.5 g/dL (ref 11.1–15.9)
Hepatitis B Surface Ag: NEGATIVE
Immature Grans (Abs): 0 10*3/uL (ref 0.0–0.1)
Immature Granulocytes: 0 %
Lymphocytes Absolute: 2 10*3/uL (ref 0.7–3.1)
Lymphs: 25 %
MCH: 31.4 pg (ref 26.6–33.0)
MCHC: 34 g/dL (ref 31.5–35.7)
MCV: 93 fL (ref 79–97)
Monocytes Absolute: 0.5 10*3/uL (ref 0.1–0.9)
Monocytes: 6 %
Neutrophils Absolute: 5.5 10*3/uL (ref 1.4–7.0)
Neutrophils: 68 %
Platelets: 226 10*3/uL (ref 150–450)
RBC: 3.98 x10E6/uL (ref 3.77–5.28)
RDW: 11.9 % (ref 11.7–15.4)
RPR Ser Ql: NONREACTIVE
Rh Factor: POSITIVE
Rubella Antibodies, IGG: 1.5 index (ref >0.99)
WBC: 8.2 10*3/uL (ref 3.4–10.8)

## 2021-04-06 LAB — B12 AND FOLATE PANEL
Folate: >20 ng/mL (ref >3.0)
Vitamin B-12: 491 pg/mL (ref 232–1245)

## 2021-04-06 LAB — HCV INTERPRETATION

## 2021-04-07 LAB — URINE CULTURE, OB REFLEX

## 2021-04-07 LAB — CULTURE, OB URINE

## 2021-04-09 LAB — CYTOLOGY - PAP
Chlamydia: NEGATIVE
Comment: NEGATIVE
Comment: NEGATIVE
Comment: NORMAL
Diagnosis: NEGATIVE
High risk HPV: NEGATIVE
Neisseria Gonorrhea: NEGATIVE

## 2021-04-22 NOTE — L&D Delivery Note (Signed)
OB/GYN Faculty Practice Delivery Note  Caroline Mack is a 31 y.o. [id number] s/p SVD at [redacted]w[redacted]d. She was admitted for SOL.   ROM: 1h 59m with clear fluid GBS Status: Negative   Delivery Date/Time: 10/20/21 at 0316  Delivery: Called to room and patient was complete and pushing. Head delivered ROA. Loose nuchal cord present and reduced at the perineum. Shoulders and body delivered in usual fashion. Infant with spontaneous cry, placed on mother's abdomen, dried and stimulated. Cord clamped x 2 after 1-minute delay and cut by FOB under direct supervision. Cord blood drawn. Placenta delivered spontaneously with gentle cord traction. Fundus firm with massage and Pitocin. Labia, perineum, vagina, and cervix were inspected, and patient was found to have bilateral labial lacerations that were repaired with 4-0 Monocryl and found to be hemostatic.   Placenta: Intact, 3VC - sent to L&D Complications: None  Lacerations: Bilateral labial  EBL: 65 cc Analgesia: Epidural   Infant: Viable female  APGARs 21 and 35    Evalina Field, MD OB/GYN Fellow, Faculty Practice

## 2021-05-02 ENCOUNTER — Ambulatory Visit (INDEPENDENT_AMBULATORY_CARE_PROVIDER_SITE_OTHER): Payer: No Typology Code available for payment source | Admitting: Family Medicine

## 2021-05-02 ENCOUNTER — Other Ambulatory Visit: Payer: Self-pay

## 2021-05-02 VITALS — BP 111/72 | HR 72 | Wt 132.0 lb

## 2021-05-02 DIAGNOSIS — Z3A14 14 weeks gestation of pregnancy: Secondary | ICD-10-CM

## 2021-05-02 DIAGNOSIS — Z3480 Encounter for supervision of other normal pregnancy, unspecified trimester: Secondary | ICD-10-CM

## 2021-05-02 NOTE — Progress Notes (Signed)
° °  PRENATAL VISIT NOTE  Subjective:  Caroline Mack is a 31 y.o. G3P1001 at [redacted]w[redacted]d being seen today for ongoing prenatal care.  She is currently monitored for the following issues for this low-risk pregnancy and has Hyperlipidemia and Supervision of other normal pregnancy, antepartum on their problem list.  Patient reports no complaints.  Contractions: Not present. Vag. Bleeding: None.  Movement: Absent. Denies leaking of fluid.   The following portions of the patient's history were reviewed and updated as appropriate: allergies, current medications, past family history, past medical history, past social history, past surgical history and problem list.   Objective:   Vitals:   05/02/21 1047  BP: 111/72  Pulse: 72  Weight: 132 lb (59.9 kg)    Fetal Status: Fetal Heart Rate (bpm): 153   Movement: Absent     General:  Alert, oriented and cooperative. Patient is in no acute distress.  Skin: Skin is warm and dry. No rash noted.   Cardiovascular: Normal heart rate noted  Respiratory: Normal respiratory effort, no problems with respiration noted  Abdomen: Soft, gravid, appropriate for gestational age.  Pain/Pressure: Absent     Pelvic: Cervical exam deferred        Extremities: Normal range of motion.  Edema: None  Mental Status: Normal mood and affect. Normal behavior. Normal judgment and thought content.   Assessment and Plan:  Pregnancy: G3P1001 at [redacted]w[redacted]d 1. [redacted] weeks gestation of pregnancy 2. Supervision of other normal pregnancy, antepartum FHT normal. No concerns. F/u in 4 weeks. PNL normal   Preterm labor symptoms and general obstetric precautions including but not limited to vaginal bleeding, contractions, leaking of fluid and fetal movement were reviewed in detail with the patient. Please refer to After Visit Summary for other counseling recommendations.   No follow-ups on file.  Future Appointments  Date Time Provider Department Center  05/31/2021  8:15 AM Levie Heritage,  DO [facility] None  06/08/2021  9:00 AM [facility] NURSE [facility]  06/08/2021  9:15 AM [facility] US2 [facility]    Levie Heritage, DO

## 2021-05-31 ENCOUNTER — Other Ambulatory Visit: Payer: Self-pay

## 2021-05-31 ENCOUNTER — Encounter: Payer: Self-pay | Admitting: Family Medicine

## 2021-05-31 ENCOUNTER — Ambulatory Visit (INDEPENDENT_AMBULATORY_CARE_PROVIDER_SITE_OTHER): Payer: No Typology Code available for payment source | Admitting: Family Medicine

## 2021-05-31 VITALS — BP 112/69 | HR 87 | Wt 134.0 lb

## 2021-05-31 DIAGNOSIS — Z3A18 18 weeks gestation of pregnancy: Secondary | ICD-10-CM

## 2021-05-31 DIAGNOSIS — Z3480 Encounter for supervision of other normal pregnancy, unspecified trimester: Secondary | ICD-10-CM

## 2021-05-31 NOTE — Progress Notes (Signed)
° °  PRENATAL VISIT NOTE  Subjective:  Caroline Mack is a 31 y.o. G2P1001 at [redacted]w[redacted]d being seen today for ongoing prenatal care.  She is currently monitored for the following issues for this low-risk pregnancy and has Hyperlipidemia and Supervision of other normal pregnancy, antepartum on their problem list.  Patient reports no complaints.  Contractions: Not present. Vag. Bleeding: None.  Movement: Present. Denies leaking of fluid.   The following portions of the patient's history were reviewed and updated as appropriate: allergies, current medications, past family history, past medical history, past social history, past surgical history and problem list.   Objective:   Vitals:   05/31/21 0820  BP: 112/69  Pulse: 87  Weight: 134 lb (60.8 kg)    Fetal Status: Fetal Heart Rate (bpm): 154   Movement: Present     General:  Alert, oriented and cooperative. Patient is in no acute distress.  Skin: Skin is warm and dry. No rash noted.   Cardiovascular: Normal heart rate noted  Respiratory: Normal respiratory effort, no problems with respiration noted  Abdomen: Soft, gravid, appropriate for gestational age.  Pain/Pressure: Absent     Pelvic: Cervical exam deferred        Extremities: Normal range of motion.  Edema: None  Mental Status: Normal mood and affect. Normal behavior. Normal judgment and thought content.   Assessment and Plan:  Pregnancy: G2P1001 at [redacted]w[redacted]d 1. [redacted] weeks gestation of pregnancy  2. Supervision of other normal pregnancy, antepartum FHT and FH normal  Preterm labor symptoms and general obstetric precautions including but not limited to vaginal bleeding, contractions, leaking of fluid and fetal movement were reviewed in detail with the patient. Please refer to After Visit Summary for other counseling recommendations.   No follow-ups on file.  Future Appointments  Date Time Provider Department Center  06/08/2021  9:00 AM [facility] NURSE [facility]  06/08/2021  9:15 AM  [facility] US2 [facility]  06/29/2021  8:15 AM Adrian Blackwater, Rhona Raider, DO [facility] None    Levie Heritage, DO

## 2021-06-04 ENCOUNTER — Ambulatory Visit: Payer: No Typology Code available for payment source

## 2021-06-08 ENCOUNTER — Other Ambulatory Visit: Payer: Self-pay

## 2021-06-08 ENCOUNTER — Ambulatory Visit: Payer: No Typology Code available for payment source | Attending: Family Medicine

## 2021-06-08 ENCOUNTER — Ambulatory Visit: Payer: No Typology Code available for payment source

## 2021-06-08 DIAGNOSIS — Z3A19 19 weeks gestation of pregnancy: Secondary | ICD-10-CM | POA: Diagnosis not present

## 2021-06-08 DIAGNOSIS — Z3480 Encounter for supervision of other normal pregnancy, unspecified trimester: Secondary | ICD-10-CM | POA: Diagnosis not present

## 2021-06-08 DIAGNOSIS — Z363 Encounter for antenatal screening for malformations: Secondary | ICD-10-CM | POA: Diagnosis not present

## 2021-06-08 DIAGNOSIS — O358XX0 Maternal care for other (suspected) fetal abnormality and damage, not applicable or unspecified: Secondary | ICD-10-CM | POA: Insufficient documentation

## 2021-06-29 ENCOUNTER — Ambulatory Visit (INDEPENDENT_AMBULATORY_CARE_PROVIDER_SITE_OTHER): Payer: No Typology Code available for payment source | Admitting: Family Medicine

## 2021-06-29 ENCOUNTER — Other Ambulatory Visit: Payer: Self-pay

## 2021-06-29 VITALS — BP 107/68 | HR 86 | Wt 137.0 lb

## 2021-06-29 DIAGNOSIS — Z3480 Encounter for supervision of other normal pregnancy, unspecified trimester: Secondary | ICD-10-CM

## 2021-06-29 DIAGNOSIS — G8929 Other chronic pain: Secondary | ICD-10-CM

## 2021-06-29 DIAGNOSIS — M533 Sacrococcygeal disorders, not elsewhere classified: Secondary | ICD-10-CM

## 2021-06-29 NOTE — Progress Notes (Signed)
? ?  PRENATAL VISIT NOTE ? ?Subjective:  ?Caroline Mack is a 31 y.o. G2P1001 at [redacted]w[redacted]d being seen today for ongoing prenatal care.  She is currently monitored for the following issues for this low-risk pregnancy and has Hyperlipidemia and Supervision of other normal pregnancy, antepartum on their problem list. ? ?Patient reports  Some left SI joint discomfort. Had this after delivery of her first child. Improved with manipulation and home PT. Now starting to flare up again .  Contractions: Not present. Vag. Bleeding: None.  Movement: Present. Denies leaking of fluid.  ? ?The following portions of the patient's history were reviewed and updated as appropriate: allergies, current medications, past family history, past medical history, past social history, past surgical history and problem list.  ? ?Objective:  ? ?Vitals:  ? 06/29/21 0818  ?BP: 107/68  ?Pulse: 86  ?Weight: 137 lb (62.1 kg)  ? ? ?Fetal Status: Fetal Heart Rate (bpm): 151   Movement: Present    ? ?General:  Alert, oriented and cooperative. Patient is in no acute distress.  ?Skin: Skin is warm and dry. No rash noted.   ?Cardiovascular: Normal heart rate noted  ?Respiratory: Normal respiratory effort, no problems with respiration noted  ?Abdomen: Soft, gravid, appropriate for gestational age.  Pain/Pressure: Absent     ?Pelvic: Cervical exam deferred        ?Extremities: Normal range of motion.  Edema: None  ?Mental Status: Normal mood and affect. Normal behavior. Normal judgment and thought content.  ? ?Assessment and Plan:  ?Pregnancy: G2P1001 at [redacted]w[redacted]d ?1. Supervision of other normal pregnancy, antepartum ?FHT and FH normal ? ?2. Chronic left SI joint pain ?- Ambulatory referral to Physical Therapy ? ?Preterm labor symptoms and general obstetric precautions including but not limited to vaginal bleeding, contractions, leaking of fluid and fetal movement were reviewed in detail with the patient. ?Please refer to After Visit Summary for other counseling  recommendations.  ? ?No follow-ups on file. ? ?Future Appointments  ?Date Time Provider Department Center  ?07/25/2021 10:15 AM Levie Heritage, DO [facility] None  ?08/08/2021  9:15 AM Levie Heritage, DO [facility] None  ? ? ?Levie Heritage, DO ?

## 2021-07-10 ENCOUNTER — Other Ambulatory Visit: Payer: Self-pay

## 2021-07-10 ENCOUNTER — Encounter: Payer: Self-pay | Admitting: Physical Therapy

## 2021-07-10 ENCOUNTER — Ambulatory Visit: Payer: No Typology Code available for payment source | Attending: Family Medicine | Admitting: Physical Therapy

## 2021-07-10 DIAGNOSIS — G8929 Other chronic pain: Secondary | ICD-10-CM | POA: Insufficient documentation

## 2021-07-10 DIAGNOSIS — M533 Sacrococcygeal disorders, not elsewhere classified: Secondary | ICD-10-CM | POA: Insufficient documentation

## 2021-07-10 DIAGNOSIS — M6281 Muscle weakness (generalized): Secondary | ICD-10-CM | POA: Insufficient documentation

## 2021-07-10 NOTE — Therapy (Addendum)
PHYSICAL THERAPY DISCHARGE SUMMARY ? ?Visits from Start of Care: 1 ? ?Current functional level related to goals / functional outcomes: ?NA evaluation only  ?  ?Remaining deficits: ?NA evaluation only  ?  ?Education / Equipment: ?HEP  ?Plan: ?Patient agrees to discharge.  Patient is being discharged due to self request, does not feel that she needs PT at this time.      ? ?Rennie Natter, PT, DPT 08/20/2021 9:09AM ? ?[place] ?[facility] ?[address] ?[place], [place], [address] ?Phone: [phone]   Fax:  [phone] ? ?Physical Therapy Evaluation ? ?Patient Details  ?Name: Caroline Mack ?MRN: [id number] ?Date of Birth: [birth year] ?Referring Provider (PT): Loma Boston DO ? ? ?Encounter Date: 07/10/2021 ? ? PT End of Session - 07/10/21 0807   ? ? Visit Number 1   ? Date for PT Re-Evaluation 09/04/21   ? Authorization - Visit Number 1   ? Authorization - Number of Visits 12   ? PT Start Time [phone]   ? PT Stop Time 0845   ? PT Time Calculation (min) 38 min   ? Activity Tolerance Patient tolerated treatment well   ? Behavior During Therapy [facility] for tasks assessed/performed   ? ?  ?  ? ?  ? ? ?Past Medical History:  ?Diagnosis Date  ? Medical history non-contributory   ? ? ?Past Surgical History:  ?Procedure Laterality Date  ? ear tumor Left   ? ? ?There were no vitals filed for this visit. ? ? ? Subjective Assessment - 07/10/21 0809   ? ? Subjective Pt is [redacted] weeks pregnant and had the same issue with her first child. She was a big runner prior to having her daughter. Jumped back in at 6 weeks. 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 She has times when she has no pain, but if it's out she feels it with every step.   ? Pertinent History [redacted] weeks pregnant (07/10/21)   ? How long can you walk comfortably? varies   ? Currently in Pain? No/denies   ? Pain Score 5    ? Pain Location Sacrum   ? Pain Orientation Left   ? Pain Descriptors / Indicators Sharp;Nagging   ? Pain  Type Chronic pain   ? Pain Onset More than a month ago   ? Pain Frequency Intermittent   ? Aggravating Factors  unsure   ? Pain Relieving Factors sitting   ? ?  ?  ? ?  ? ? ? ? ? [facility] PT Assessment - 07/10/21 0001   ? ?  ? Assessment  ? Medical Diagnosis chronic left SIJ pain   ? Referring Provider (PT) Loma Boston DO   ? Onset Date/Surgical Date 06/19/21   ? Hand Dominance Right   ? Next MD Visit April   ?  ? Precautions  ? Precautions Other (comment)   ? Precaution Comments Pregnant   ?  ? Restrictions  ? Weight Bearing Restrictions No   ?  ? Balance Screen  ? Has the patient fallen in the past 6 months No   ? Has the patient had a decrease in activity level because of a fear of falling?  No   ? Is the patient reluctant to leave their home because of a fear of falling?  No   ?  ? Home Environment  ? Living Environment Private residence   ? Additional Comments no problem with stairs   ?  ?  Prior Function  ? Level of Independence Independent   ? Vocation Full time employment   ? Vocation Requirements nurse: 12 hour shifts   ?  ? Posture/Postural Control  ? Posture/Postural Control Postural limitations   ? Posture Comments elevated Lt Iliac crest and PSIS   ?  ? ROM / Strength  ? AROM / PROM / Strength AROM;Strength   ?  ? AROM  ? Overall AROM Comments full lumbar some tightness with left rotation and right SB   ?  ? Strength  ? Overall Strength Comments hip flex R 4-/5, L 4+/5 with poor coor stab with R testing; ext, ABD, ADD 5/5 B; R knee flex 4+/5 else B knees 5/5   ?  ? Flexibility  ? Soft Tissue Assessment /Muscle Length yes   ? Hamstrings mild tightness bil   ? Quadriceps mild right   ? ITB WNL   ? Piriformis mild left   ?  ? Palpation  ? SI assessment  elevated Lt Iliac crest and PSIS   ? Palpation comment unremarkable   ? ?  ?  ? ?  ? ? ? ? ? ? ? ? ? ? ? ? ? ?Objective measurements completed on examination: See above findings.  ? ? ? ? ? ? ? ? ? ? ? ? ? ? PT Education - 07/10/21 0846   ? ? Education Details  HEP; POC   ? Person(s) Educated Patient   ? Methods Explanation;Demonstration;Handout   ? Comprehension Verbalized understanding;Returned demonstration   ? ?  ?  ? ?  ? ? ? PT Short Term Goals - 07/10/21 1247   ? ?  ? PT SHORT TERM GOAL #1  ? Title ind with iniital HEP   ? Time 4   ? Status New   ? Target Date 08/07/21   ?  ? PT SHORT TERM GOAL #2  ? Title Pt to demonstrate correct pelvic alignment.   ? Time 4   ? Period Weeks   ? Status New   ? ?  ?  ? ?  ? ? ? ? PT Long Term Goals - 07/10/21 1247   ? ?  ? PT LONG TERM GOAL #1  ? Title Ind with advanced HEP   ? Time 8   ? Period Weeks   ? Status New   ? Target Date 09/04/21   ?  ? PT LONG TERM GOAL #2  ? Title Patient to report decreased frequency and intensity of SIJ pain by >= 75% with ADLS   ? Time 8   ? Period Weeks   ? Status New   ?  ? PT LONG TERM GOAL #3  ? Title Pt to demo improved proximal hip strength to 5/5   ? Time 8   ? Period Weeks   ? Status New   ? ?  ?  ? ?  ? ? ? ? ? ? ? ? ? Plan - 07/10/21 0847   ? ? Clinical Impression Statement Patient is a 31 y.o. pregnant female (25 wks) with c/o of left chronic SIJ pain. Pain began originally after the birth of her first child and has been intermittent since then. When her joint is out of alignment she has pain with every step. Patient is a runner, but has not been running much due to pain. 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 She has some mild strenth  deficits B and mild flexibility deficits in her hamstrings, hip flexors and left piriformis. She presents with a high left iliac crest and PSIS which we corrected with MET. Hip strengthening exercises were issued to help stabilize her pelvis. Patient plans to do her HEP for 2 weeks and then return as needed for skilled care to ensure correct alignment.   ? Stability/Clinical Decision Making Stable/Uncomplicated   ? Clinical Decision Making Low   ? Rehab Potential Excellent   ? PT Frequency Biweekly   ?  PT Duration 8 weeks   ? PT Treatment/Interventions ADLs/Self Care Home Management;Aquatic Therapy;Moist Heat;Cryotherapy;Neuromuscular re-education;Therapeutic exercise;Therapeutic activities;Patient/family education;Manual techniques;Taping   ? PT Next Visit Plan assess pelvic alignment and HEP; progress strength and stabilization   ? PT Home Exercise Plan [email or web address]   ? Consulted and Agree with Plan of Care Patient   ? ?  ?  ? ?  ? ? ?Patient will benefit from skilled therapeutic intervention in order to improve the following deficits and impairments:  Impaired flexibility, Pain, Decreased strength, Postural dysfunction, Hypermobility ? ?Visit Diagnosis: ?Sacrococcygeal disorders, not elsewhere classified ? ?Muscle weakness (generalized) ? ? ? ? ?Problem List ?Patient Active Problem List  ? Diagnosis Date Noted  ? Supervision of other normal pregnancy, antepartum 04/05/2021  ? Hyperlipidemia 05/05/2020  ? ?Madelyn Flavors, PT ?07/10/2021, 12:53 PM ? ?[place] ?[facility] ?[address] ?[place], [place], [address] ?Phone: [phone]   Fax:  [phone] ? ?Name: Caroline Mack ?MRN: [id number] ?Date of Birth: 08/13/1990 ? ? ?

## 2021-07-10 NOTE — Patient Instructions (Signed)
Access Code: [email or web address] ?URL: [email or web address] ?Date: 07/10/2021 ?Prepared by: Raynelle Fanning ? ?Exercises ?Single Leg Bridge with Leg Supported - 1 x daily - 7 x weekly - 1 sets - 3 reps - 5 hold ?Seated Hip Adduction Squeeze with Ball - 1 x daily - 7 x weekly - 2 sets - 10 reps - 10 hold ?Clamshell with Resistance - 1 x daily - 3 x weekly - 3 sets - 10 reps ?Standing Repeated Hip Abduction with Resistance - 1 x daily - 3 x weekly - 3 sets - 10 reps ?Standing Hip Adduction with Anchored Resistance - 1 x daily - 3 x weekly - 3 sets - 10 reps ?Standing Repeated Hip Extension with Resistance - 1 x daily - 3 x weekly - 3 sets - 10 reps ?Standing Hip Flexion with Resistance Loop - 1 x daily - 3 x weekly - 3 sets - 10 reps ?Marching Bridge - 1 x daily - 3 x weekly - 3 sets - 10 reps ?Bridge with Hip Abduction and Resistance - 1 x daily - 3 x weekly - 1-3 sets - 10 reps ?Supine Quadriceps Stretch with Strap on Table - 2 x daily - 7 x weekly - 1 sets - 3 reps - 30-60 sec hold ? ?

## 2021-07-20 ENCOUNTER — Ambulatory Visit: Payer: No Typology Code available for payment source

## 2021-07-25 ENCOUNTER — Ambulatory Visit (INDEPENDENT_AMBULATORY_CARE_PROVIDER_SITE_OTHER): Payer: No Typology Code available for payment source | Admitting: Family Medicine

## 2021-07-25 VITALS — BP 106/66 | HR 79 | Wt 139.0 lb

## 2021-07-25 DIAGNOSIS — Z23 Encounter for immunization: Secondary | ICD-10-CM | POA: Diagnosis not present

## 2021-07-25 DIAGNOSIS — Z3480 Encounter for supervision of other normal pregnancy, unspecified trimester: Secondary | ICD-10-CM | POA: Diagnosis not present

## 2021-07-25 DIAGNOSIS — Z3A26 26 weeks gestation of pregnancy: Secondary | ICD-10-CM | POA: Diagnosis not present

## 2021-07-25 NOTE — Progress Notes (Signed)
? ?  PRENATAL VISIT NOTE ? ?Subjective:  ?Caroline Mack is a 31 y.o. G2P1001 at [redacted]w[redacted]d being seen today for ongoing prenatal care.  She is currently monitored for the following issues for this low-risk pregnancy and has Hyperlipidemia and Supervision of other normal pregnancy, antepartum on their problem list. ? ?Patient reports no complaints. SI joint pain somewhat improved. Has seen PT and has some exercises that she is doing. Contractions: Not present. Vag. Bleeding: None.  Movement: Present. Denies leaking of fluid.  ? ?The following portions of the patient's history were reviewed and updated as appropriate: allergies, current medications, past family history, past medical history, past social history, past surgical history and problem list.  ? ?Objective:  ? ?Vitals:  ? 07/25/21 0825  ?BP: 106/66  ?Pulse: 79  ?Weight: 139 lb (63 kg)  ? ? ?Fetal Status: Fetal Heart Rate (bpm): 138 Fundal Height: 27 cm Movement: Present    ? ?General:  Alert, oriented and cooperative. Patient is in no acute distress.  ?Skin: Skin is warm and dry. No rash noted.   ?Cardiovascular: Normal heart rate noted  ?Respiratory: Normal respiratory effort, no problems with respiration noted  ?Abdomen: Soft, gravid, appropriate for gestational age.  Pain/Pressure: Absent     ?Pelvic: Cervical exam deferred        ?Extremities: Normal range of motion.  Edema: None  ?Mental Status: Normal mood and affect. Normal behavior. Normal judgment and thought content.  ? ?Assessment and Plan:  ?Pregnancy: G2P1001 at [redacted]w[redacted]d ?1. [redacted] weeks gestation of pregnancy ?- Glucose Tolerance, 2 Hours w/1 Hour ?- RPR ?- HIV antibody (with reflex) ?- CBC ?- Tdap vaccine greater than or equal to 7yo IM ? ?2. Supervision of other normal pregnancy, antepartum ?- Glucose Tolerance, 2 Hours w/1 Hour ?- RPR ?- HIV antibody (with reflex) ?- CBC ?- Tdap vaccine greater than or equal to 7yo IM ? ?Preterm labor symptoms and general obstetric precautions including but not limited  to vaginal bleeding, contractions, leaking of fluid and fetal movement were reviewed in detail with the patient. ?Please refer to After Visit Summary for other counseling recommendations.  ? ?No follow-ups on file. ? ?Future Appointments  ?Date Time Provider Department Center  ?07/25/2021 10:15 AM Levie Heritage, DO [facility] None  ?08/08/2021  9:15 AM Levie Heritage, DO [facility] None  ?08/09/2021  8:45 AM Jena Gauss, PT [facility]  ?08/24/2021  8:00 AM Jena Gauss, PT [facility]  ? ? ?Levie Heritage, DO ?

## 2021-07-26 ENCOUNTER — Ambulatory Visit: Payer: No Typology Code available for payment source | Admitting: Physical Therapy

## 2021-07-26 LAB — CBC
Hematocrit: 35.5 % (ref 34.0–46.6)
Hemoglobin: 11.9 g/dL (ref 11.1–15.9)
MCH: 31.9 pg (ref 26.6–33.0)
MCHC: 33.5 g/dL (ref 31.5–35.7)
MCV: 95 fL (ref 79–97)
Platelets: 228 10*3/uL (ref 150–450)
RBC: 3.73 x10E6/uL — ABNORMAL LOW (ref 3.77–5.28)
RDW: 12.2 % (ref 11.7–15.4)
WBC: 6.8 10*3/uL (ref 3.4–10.8)

## 2021-07-26 LAB — GLUCOSE TOLERANCE, 2 HOURS W/ 1HR
Glucose, 1 hour: 135 mg/dL (ref 70–179)
Glucose, 2 hour: 98 mg/dL (ref 70–152)
Glucose, Fasting: 74 mg/dL (ref 70–91)

## 2021-07-26 LAB — HIV ANTIBODY (ROUTINE TESTING W REFLEX): HIV Screen 4th Generation wRfx: NONREACTIVE

## 2021-07-26 LAB — RPR: RPR Ser Ql: NONREACTIVE

## 2021-08-08 ENCOUNTER — Ambulatory Visit (INDEPENDENT_AMBULATORY_CARE_PROVIDER_SITE_OTHER): Payer: No Typology Code available for payment source | Admitting: Family Medicine

## 2021-08-08 VITALS — BP 108/68 | HR 89 | Wt 144.0 lb

## 2021-08-08 DIAGNOSIS — Z3480 Encounter for supervision of other normal pregnancy, unspecified trimester: Secondary | ICD-10-CM

## 2021-08-08 NOTE — Progress Notes (Signed)
? ?  PRENATAL VISIT NOTE ? ?Subjective:  ?Caroline Mack is a 31 y.o. G2P1001 at [redacted]w[redacted]d being seen today for ongoing prenatal care.  She is currently monitored for the following issues for this low-risk pregnancy and has Hyperlipidemia and Supervision of other normal pregnancy, antepartum on their problem list. ? ?Patient reports no complaints.  Contractions: Not present. Vag. Bleeding: None.  Movement: Present. Denies leaking of fluid.  ? ?The following portions of the patient's history were reviewed and updated as appropriate: allergies, current medications, past family history, past medical history, past social history, past surgical history and problem list.  ? ?Objective:  ? ?Vitals:  ? 08/08/21 0927  ?BP: 108/68  ?Pulse: 89  ?Weight: 144 lb (65.3 kg)  ? ? ?Fetal Status: Fetal Heart Rate (bpm): 135   Movement: Present    ? ?General:  Alert, oriented and cooperative. Patient is in no acute distress.  ?Skin: Skin is warm and dry. No rash noted.   ?Cardiovascular: Normal heart rate noted  ?Respiratory: Normal respiratory effort, no problems with respiration noted  ?Abdomen: Soft, gravid, appropriate for gestational age.  Pain/Pressure: Absent     ?Pelvic: Cervical exam deferred        ?Extremities: Normal range of motion.  Edema: None  ?Mental Status: Normal mood and affect. Normal behavior. Normal judgment and thought content.  ? ?Assessment and Plan:  ?Pregnancy: G2P1001 at [redacted]w[redacted]d ?1. Supervision of other normal pregnancy, antepartum ?FHT and FH normal ? ?Preterm labor symptoms and general obstetric precautions including but not limited to vaginal bleeding, contractions, leaking of fluid and fetal movement were reviewed in detail with the patient. ?Please refer to After Visit Summary for other counseling recommendations.  ? ?No follow-ups on file. ? ?Future Appointments  ?Date Time Provider Department Center  ?08/24/2021  8:00 AM Jena Gauss, PT [facility]  ?08/24/2021  9:55 AM Adrian Blackwater Rhona Raider, DO  [facility] None  ?09/05/2021 10:35 AM Adrian Blackwater Rhona Raider, DO [facility] None  ?09/19/2021  8:35 AM Milas Hock, MD [facility] None  ?10/10/2021  8:15 AM Adrian Blackwater Rhona Raider, DO [facility] None  ?10/17/2021  8:15 AM Levie Heritage, DO [facility] None  ? ? ?Levie Heritage, DO ?

## 2021-08-09 ENCOUNTER — Encounter: Payer: No Typology Code available for payment source | Admitting: Physical Therapy

## 2021-08-24 ENCOUNTER — Encounter: Payer: No Typology Code available for payment source | Admitting: Physical Therapy

## 2021-08-24 ENCOUNTER — Ambulatory Visit (INDEPENDENT_AMBULATORY_CARE_PROVIDER_SITE_OTHER): Payer: No Typology Code available for payment source | Admitting: Family Medicine

## 2021-08-24 VITALS — BP 103/66 | HR 84 | Wt 146.0 lb

## 2021-08-24 DIAGNOSIS — Z3A30 30 weeks gestation of pregnancy: Secondary | ICD-10-CM

## 2021-08-24 DIAGNOSIS — Z3480 Encounter for supervision of other normal pregnancy, unspecified trimester: Secondary | ICD-10-CM

## 2021-08-24 NOTE — Progress Notes (Signed)
? ?  PRENATAL VISIT NOTE ? ?Subjective:  ?Caroline Mack is a 31 y.o. G2P1001 at [redacted]w[redacted]d being seen today for ongoing prenatal care.  She is currently monitored for the following issues for this low-risk pregnancy and has Hyperlipidemia and Supervision of other normal pregnancy, antepartum on their problem list. ? ?Patient reports no complaints.  Contractions: Not present. Vag. Bleeding: None.  Movement: Present. Denies leaking of fluid.  ? ?The following portions of the patient's history were reviewed and updated as appropriate: allergies, current medications, past family history, past medical history, past social history, past surgical history and problem list.  ? ?Objective:  ? ?Vitals:  ? 08/24/21 0959  ?BP: 103/66  ?Pulse: 84  ?Weight: 146 lb (66.2 kg)  ? ? ?Fetal Status: Fetal Heart Rate (bpm): 133   Movement: Present    ? ?General:  Alert, oriented and cooperative. Patient is in no acute distress.  ?Skin: Skin is warm and dry. No rash noted.   ?Cardiovascular: Normal heart rate noted  ?Respiratory: Normal respiratory effort, no problems with respiration noted  ?Abdomen: Soft, gravid, appropriate for gestational age.  Pain/Pressure: Absent     ?Pelvic: Cervical exam deferred        ?Extremities: Normal range of motion.  Edema: None  ?Mental Status: Normal mood and affect. Normal behavior. Normal judgment and thought content.  ? ?Assessment and Plan:  ?Pregnancy: G2P1001 at [redacted]w[redacted]d ?1. [redacted] weeks gestation of pregnancy ? ?2. Supervision of other normal pregnancy, antepartum ?FHT and FH normal ? ?Preterm labor symptoms and general obstetric precautions including but not limited to vaginal bleeding, contractions, leaking of fluid and fetal movement were reviewed in detail with the patient. ?Please refer to After Visit Summary for other counseling recommendations.  ? ?No follow-ups on file. ? ?Future Appointments  ?Date Time Provider Department Center  ?09/05/2021 10:35 AM Adrian Blackwater Rhona Raider, DO [facility] None  ?09/19/2021   8:35 AM Milas Hock, MD [facility] None  ?10/10/2021  8:15 AM Adrian Blackwater Rhona Raider, DO [facility] None  ?10/17/2021  8:15 AM Levie Heritage, DO [facility] None  ? ? ?Levie Heritage, DO ?

## 2021-09-01 IMAGING — MG DIGITAL DIAGNOSTIC BILAT W/ TOMO W/ CAD
6 of 10 series · 6 of 30 positions shown · non-contrast
Comparison: None.

CLINICAL DATA: Patient presents with a focal area prominence and
tenderness in the axillary tail region of the left breast, which
waxes and wanes with her menstrual cycle.

EXAM:
DIGITAL DIAGNOSTIC BILATERAL MAMMOGRAM WITH TOMOSYNTHESIS AND CAD;
ULTRASOUND LEFT BREAST LIMITED
TECHNIQUE: Bilateral digital diagnostic mammography and breast tomosynthesis
was performed. The images were evaluated with computer-aided
detection.; Targeted ultrasound examination of the left breast was
performed

[L MLO synth-2D]
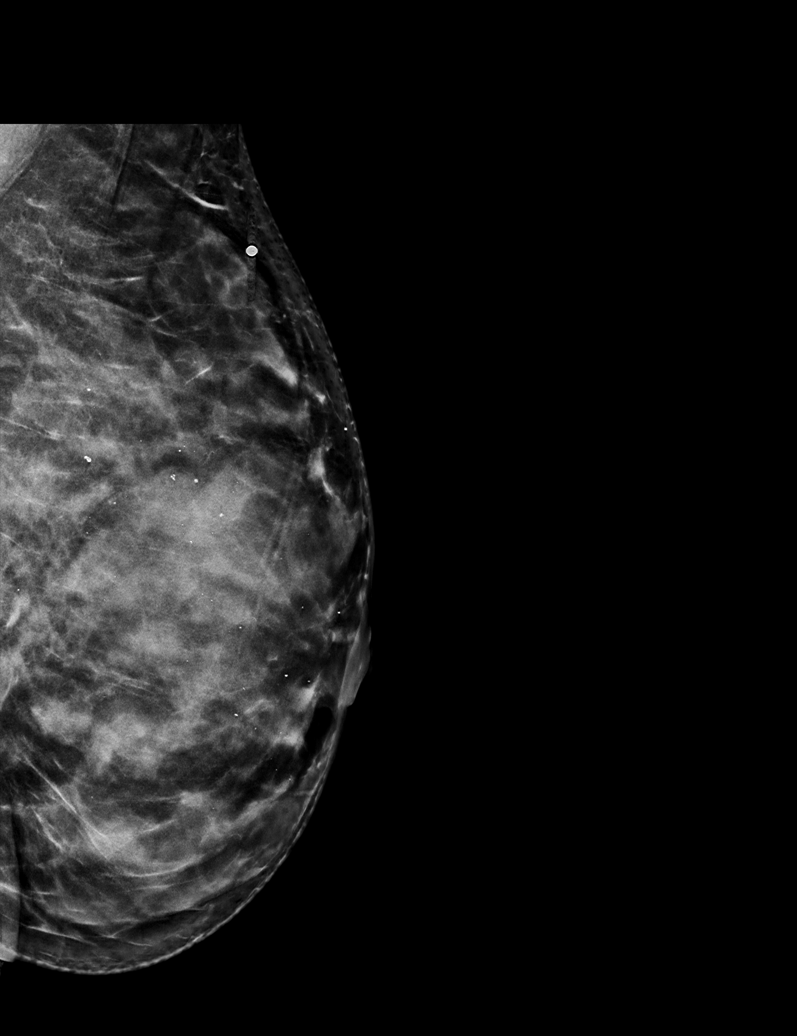

[L CC synth-2D]
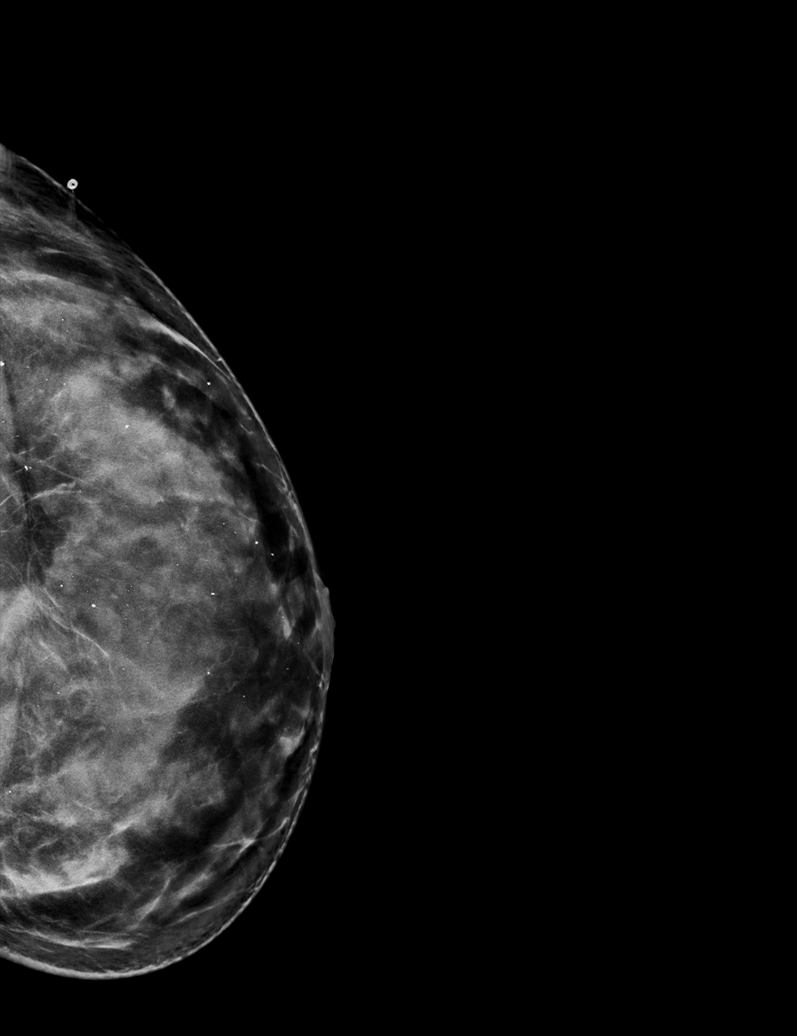

[R CC synth-2D]
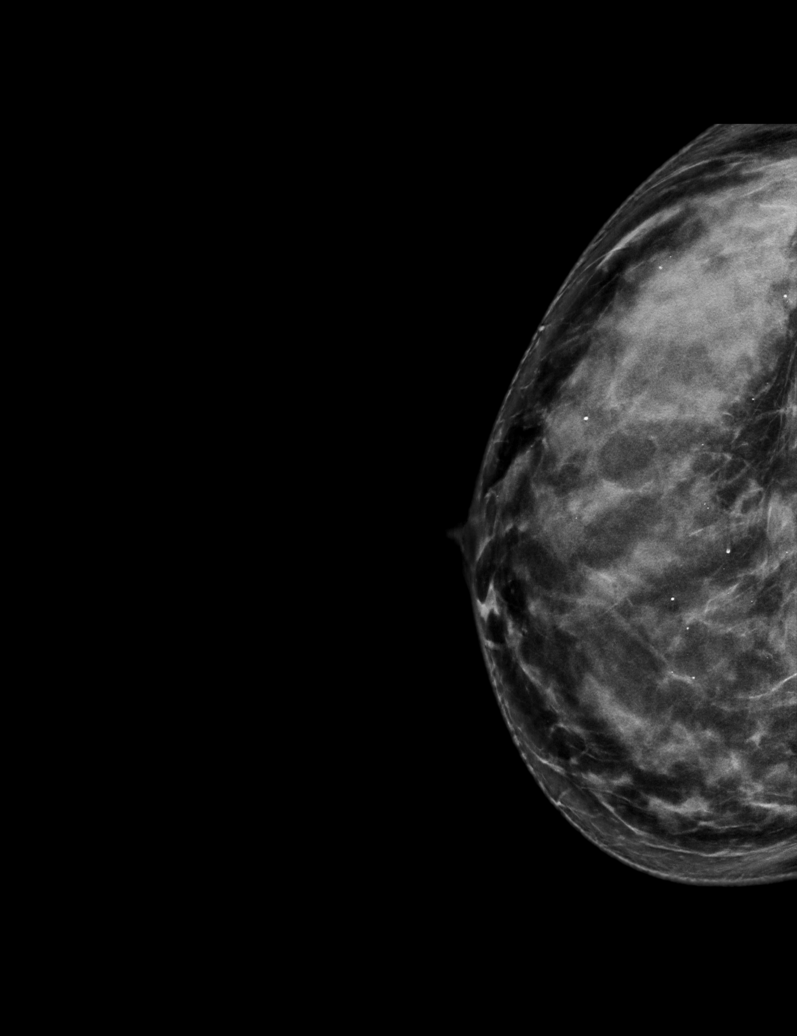

[L TAN synth-2D]
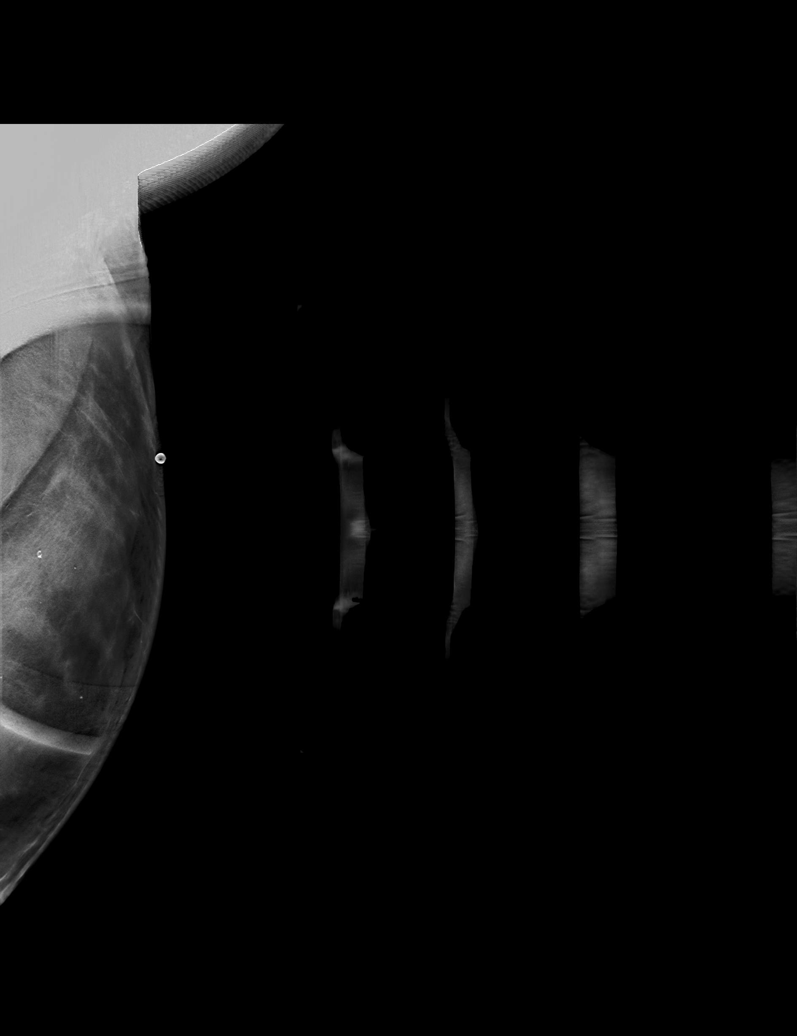

[R MLO synth-2D]
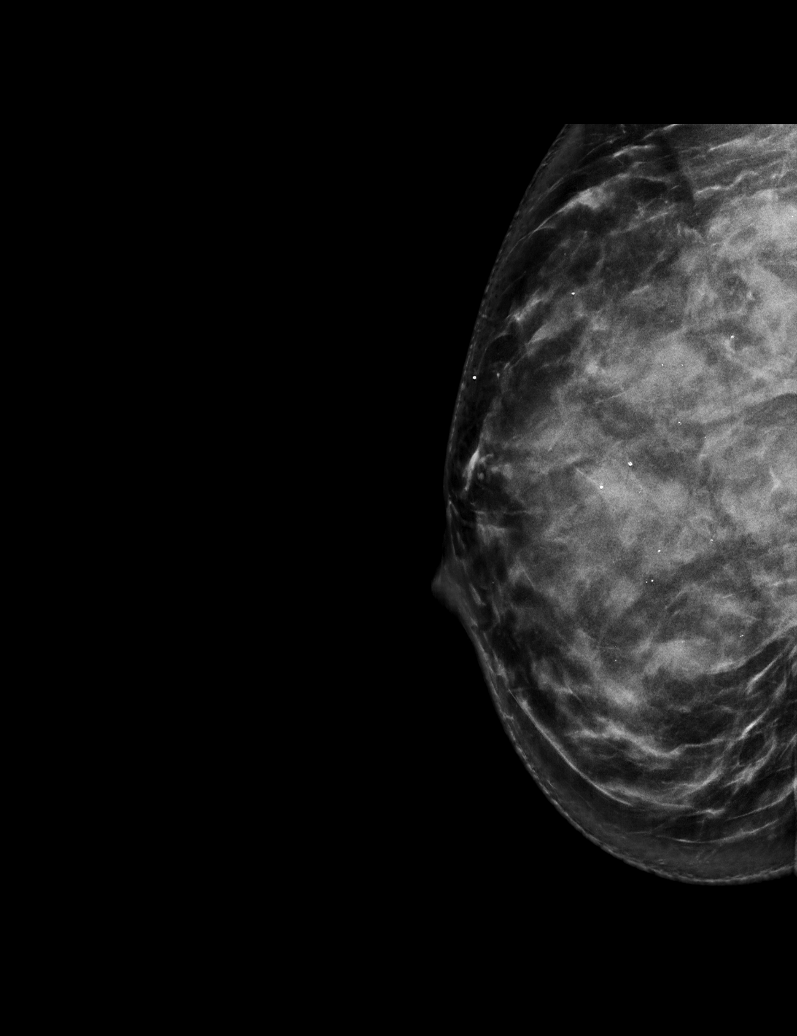

[L TAN tomo · tomo slice 37/74.0]
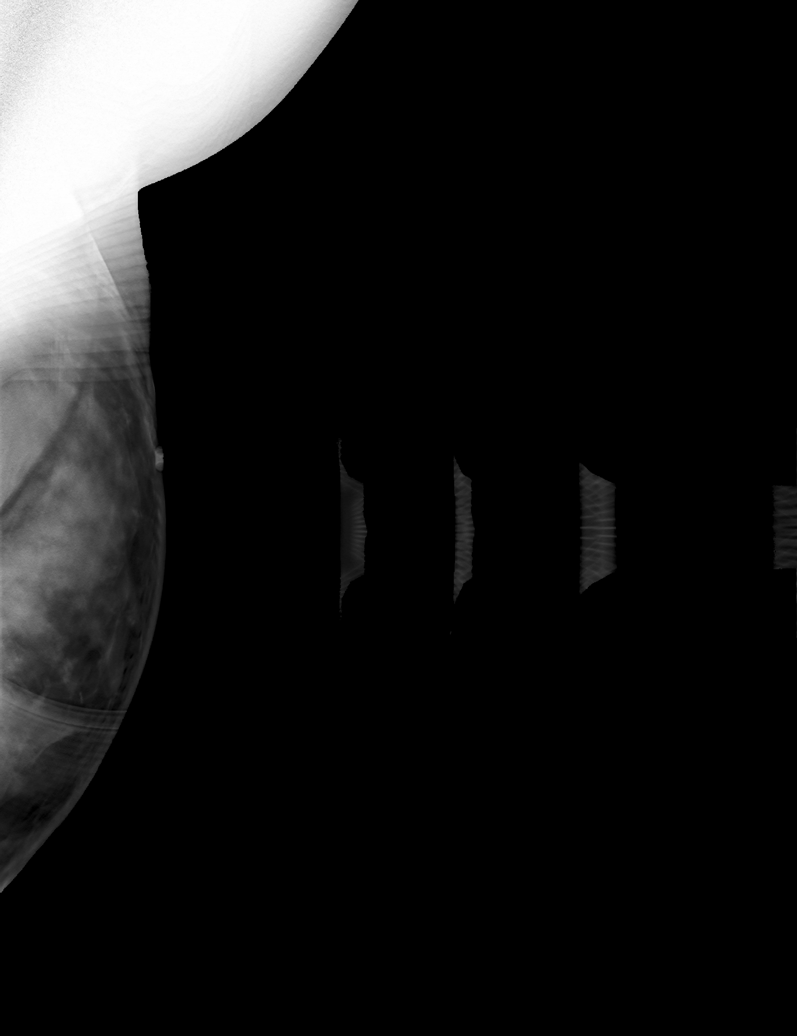

[6 of 30 positions shown; findings below may reference images not displayed]

ACR Breast Density Category d: The breast tissue is extremely dense,
which lowers the sensitivity of mammography.
FINDINGS: There are no masses, areas of architectural distortion, areas of
significant asymmetry or suspicious calcifications.

On physical exam, there is a ridge of prominent tissue in the
axillary tail region of the left breast without a defined mass.

Targeted ultrasound is performed, showing prominent fibroglandular
tissue in the upper outer/axillary tail region of the left breast.
No mass, cyst or suspicious lesion.
IMPRESSION: No evidence of breast malignancy.

RECOMMENDATION:
Screening mammogram at age 40 unless there are persistent or
intervening clinical concerns. (Code:EB-6-TRL)

I have discussed the findings and recommendations with the patient.
If applicable, a reminder letter will be sent to the patient
regarding the next appointment.

BI-RADS CATEGORY  1: Negative.

## 2021-09-05 ENCOUNTER — Encounter: Payer: Self-pay | Admitting: General Practice

## 2021-09-05 ENCOUNTER — Ambulatory Visit (INDEPENDENT_AMBULATORY_CARE_PROVIDER_SITE_OTHER): Payer: No Typology Code available for payment source | Admitting: Family Medicine

## 2021-09-05 VITALS — BP 99/68 | HR 86 | Wt 147.0 lb

## 2021-09-05 DIAGNOSIS — Z3A32 32 weeks gestation of pregnancy: Secondary | ICD-10-CM

## 2021-09-05 DIAGNOSIS — Z3480 Encounter for supervision of other normal pregnancy, unspecified trimester: Secondary | ICD-10-CM

## 2021-09-05 NOTE — Progress Notes (Signed)
? ?  PRENATAL VISIT NOTE ? ?Subjective:  ?Caroline Mack is a 31 y.o. G2P1001 at [redacted]w[redacted]d being seen today for ongoing prenatal care.  She is currently monitored for the following issues for this low-risk pregnancy and has Hyperlipidemia and Supervision of other normal pregnancy, antepartum on their problem list. ? ?Patient reports no complaints.  Contractions: Not present. Vag. Bleeding: None.  Movement: Present. Denies leaking of fluid.  ? ?The following portions of the patient's history were reviewed and updated as appropriate: allergies, current medications, past family history, past medical history, past social history, past surgical history and problem list.  ? ?Objective:  ? ?Vitals:  ? 09/05/21 1022  ?BP: 99/68  ?Pulse: 86  ?Weight: 147 lb (66.7 kg)  ? ? ?Fetal Status: Fetal Heart Rate (bpm): 140 Fundal Height: 32 cm Movement: Present    ? ?General:  Alert, oriented and cooperative. Patient is in no acute distress.  ?Skin: Skin is warm and dry. No rash noted.   ?Cardiovascular: Normal heart rate noted  ?Respiratory: Normal respiratory effort, no problems with respiration noted  ?Abdomen: Soft, gravid, appropriate for gestational age.  Pain/Pressure: Present     ?Pelvic: Cervical exam deferred        ?Extremities: Normal range of motion.  Edema: None  ?Mental Status: Normal mood and affect. Normal behavior. Normal judgment and thought content.  ? ?Assessment and Plan:  ?Pregnancy: G2P1001 at [redacted]w[redacted]d ?1. [redacted] weeks gestation of pregnancy ?FHT and FH normal ? ?Preterm labor symptoms and general obstetric precautions including but not limited to vaginal bleeding, contractions, leaking of fluid and fetal movement were reviewed in detail with the patient. ?Please refer to After Visit Summary for other counseling recommendations.  ? ?No follow-ups on file. ? ?Future Appointments  ?Date Time Provider Department Center  ?09/19/2021  8:35 AM Milas Hock, MD [facility] None  ?10/10/2021  8:15 AM Adrian Blackwater Rhona Raider, DO [facility]  None  ?10/17/2021  8:15 AM Levie Heritage, DO [facility] None  ? ? ?Levie Heritage, DO ?

## 2021-09-12 DIAGNOSIS — E039 Hypothyroidism, unspecified: Secondary | ICD-10-CM | POA: Insufficient documentation

## 2021-09-12 NOTE — Progress Notes (Signed)
   PRENATAL VISIT NOTE  Subjective:  Caroline Mack is a 31 y.o. G2P1001 at [redacted]w[redacted]d being seen today for ongoing prenatal care.  She is currently monitored for the following issues for this low-risk pregnancy and has Supervision of other normal pregnancy, antepartum on their problem list.  Patient reports no complaints.  Contractions: Irritability. Vag. Bleeding: None.  Movement: Present. Denies leaking of fluid.   The following portions of the patient's history were reviewed and updated as appropriate: allergies, current medications, past family history, past medical history, past social history, past surgical history and problem list.   Objective:   Vitals:   09/19/21 0831  BP: 99/71  Pulse: 91  Weight: 150 lb (68 kg)    Fetal Status: Fetal Heart Rate (bpm): 123 Fundal Height: 35 cm Movement: Present     General:  Alert, oriented and cooperative. Patient is in no acute distress.  Skin: Skin is warm and dry. No rash noted.   Cardiovascular: Normal heart rate noted  Respiratory: Normal respiratory effort, no problems with respiration noted  Abdomen: Soft, gravid, appropriate for gestational age.  Pain/Pressure: Absent     Pelvic: Cervical exam deferred        Extremities: Normal range of motion.  Edema: None  Mental Status: Normal mood and affect. Normal behavior. Normal judgment and thought content.   Assessment and Plan:  Pregnancy: G2P1001 at [redacted]w[redacted]d 1. Supervision of other normal pregnancy, antepartum - Cultures next time - Reviewed birth control plan: vasectomy.Plans condoms until that time.  - Had IOL at 40w last time - may want that again. Had IOL due to borderline low fluid.   Preterm labor symptoms and general obstetric precautions including but not limited to vaginal bleeding, contractions, leaking of fluid and fetal movement were reviewed in detail with the patient. Please refer to After Visit Summary for other counseling recommendations.   No follow-ups on  file.  Future Appointments  Date Time Provider Department Center  10/10/2021  8:15 AM Levie Heritage, DO [facility] None  10/17/2021  8:15 AM Adrian Blackwater Rhona Raider, DO [facility] None    Milas Hock, MD

## 2021-09-19 ENCOUNTER — Encounter: Payer: Self-pay | Admitting: Obstetrics and Gynecology

## 2021-09-19 ENCOUNTER — Ambulatory Visit (INDEPENDENT_AMBULATORY_CARE_PROVIDER_SITE_OTHER): Payer: No Typology Code available for payment source | Admitting: Obstetrics and Gynecology

## 2021-09-19 VITALS — BP 99/71 | HR 91 | Wt 150.0 lb

## 2021-09-19 DIAGNOSIS — Z3483 Encounter for supervision of other normal pregnancy, third trimester: Secondary | ICD-10-CM

## 2021-09-19 DIAGNOSIS — Z3A34 34 weeks gestation of pregnancy: Secondary | ICD-10-CM

## 2021-09-19 DIAGNOSIS — Z3480 Encounter for supervision of other normal pregnancy, unspecified trimester: Secondary | ICD-10-CM

## 2021-10-08 ENCOUNTER — Other Ambulatory Visit (HOSPITAL_COMMUNITY)
Admission: RE | Admit: 2021-10-08 | Discharge: 2021-10-08 | Disposition: A | Discharge status: Home / Self Care | Payer: No Typology Code available for payment source | Source: Ambulatory Visit | Attending: Family Medicine | Admitting: Family Medicine

## 2021-10-08 ENCOUNTER — Ambulatory Visit (INDEPENDENT_AMBULATORY_CARE_PROVIDER_SITE_OTHER): Payer: No Typology Code available for payment source | Admitting: Family Medicine

## 2021-10-08 VITALS — BP 120/79 | HR 89 | Wt 153.0 lb

## 2021-10-08 DIAGNOSIS — Z3480 Encounter for supervision of other normal pregnancy, unspecified trimester: Secondary | ICD-10-CM | POA: Insufficient documentation

## 2021-10-08 DIAGNOSIS — Z3A37 37 weeks gestation of pregnancy: Secondary | ICD-10-CM

## 2021-10-08 NOTE — Progress Notes (Signed)
   PRENATAL VISIT NOTE  Subjective:  Caroline Mack is a 31 y.o. G2P1001 at [redacted]w[redacted]d being seen today for ongoing prenatal care.  She is currently monitored for the following issues for this low-risk pregnancy and has Supervision of other normal pregnancy, antepartum on their problem list.  Patient reports no complaints.  Contractions: Irritability. Vag. Bleeding: None.  Movement: Present. Denies leaking of fluid.   The following portions of the patient's history were reviewed and updated as appropriate: allergies, current medications, past family history, past medical history, past social history, past surgical history and problem list.   Objective:   Vitals:   10/08/21 0808  BP: 120/79  Pulse: 89  Weight: 69.4 kg    Fetal Status: Fetal Heart Rate (bpm): 125 Fundal Height: 35 cm Movement: Present  Presentation: Vertex  General:  Alert, oriented and cooperative. Patient is in no acute distress.  Skin: Skin is warm and dry. No rash noted.   Cardiovascular: Normal heart rate noted  Respiratory: Normal respiratory effort, no problems with respiration noted  Abdomen: Soft, gravid, appropriate for gestational age.  Pain/Pressure: Absent     Pelvic: Cervical exam performed in the presence of a chaperone Dilation: 2.5 Effacement (%): 50 Station: -2  Extremities: Normal range of motion.  Edema: None  Mental Status: Normal mood and affect. Normal behavior. Normal judgment and thought content.   Assessment and Plan:  Pregnancy: G2P1001 at [redacted]w[redacted]d 1. [redacted] weeks gestation of pregnancy   2. Supervision of other normal pregnancy, antepartum Cultures today Labor precautions - Culture, beta strep (group b only) - Cervicovaginal ancillary only( )  Preterm labor symptoms and general obstetric precautions including but not limited to vaginal bleeding, contractions, leaking of fluid and fetal movement were reviewed in detail with the patient. Please refer to After Visit Summary for other  counseling recommendations.   Return in 1 week (on 10/15/2021).  Future Appointments  Date Time Provider Department Center  10/17/2021  8:15 AM Levie Heritage, DO [facility] None  10/25/2021 10:55 AM Adrian Blackwater, Rhona Raider, DO [facility] None    Reva Bores, MD

## 2021-10-09 LAB — CERVICOVAGINAL ANCILLARY ONLY
Chlamydia: NEGATIVE
Comment: NEGATIVE
Comment: NORMAL
Neisseria Gonorrhea: NEGATIVE

## 2021-10-10 ENCOUNTER — Encounter: Payer: No Typology Code available for payment source | Admitting: Family Medicine

## 2021-10-12 LAB — CULTURE, BETA STREP (GROUP B ONLY): Strep Gp B Culture: NEGATIVE

## 2021-10-17 ENCOUNTER — Telehealth (HOSPITAL_COMMUNITY): Payer: Self-pay | Admitting: *Deleted

## 2021-10-17 ENCOUNTER — Encounter (HOSPITAL_COMMUNITY): Payer: Self-pay

## 2021-10-17 ENCOUNTER — Ambulatory Visit (INDEPENDENT_AMBULATORY_CARE_PROVIDER_SITE_OTHER): Payer: No Typology Code available for payment source | Admitting: Family Medicine

## 2021-10-17 ENCOUNTER — Encounter (HOSPITAL_COMMUNITY): Payer: Self-pay | Admitting: *Deleted

## 2021-10-17 VITALS — BP 108/64 | HR 81 | Wt 154.0 lb

## 2021-10-17 DIAGNOSIS — Z3480 Encounter for supervision of other normal pregnancy, unspecified trimester: Secondary | ICD-10-CM

## 2021-10-17 NOTE — Progress Notes (Signed)
   PRENATAL VISIT NOTE  Subjective:  Caroline Mack is a 31 y.o. G2P1001 at [redacted]w[redacted]d being seen today for ongoing prenatal care.  She is currently monitored for the following issues for this low-risk pregnancy and has Supervision of other normal pregnancy, antepartum on their problem list.  Patient reports occasional contractions.  Contractions: Irritability. Vag. Bleeding: None.  Movement: Present. Denies leaking of fluid.   The following portions of the patient's history were reviewed and updated as appropriate: allergies, current medications, past family history, past medical history, past social history, past surgical history and problem list.   Objective:   Vitals:   10/17/21 0824  BP: 108/64  Pulse: 81  Weight: 154 lb (69.9 kg)    Fetal Status: Fetal Heart Rate (bpm): 135 Fundal Height: 37 cm Movement: Present  Presentation: Vertex  General:  Alert, oriented and cooperative. Patient is in no acute distress.  Skin: Skin is warm and dry. No rash noted.   Cardiovascular: Normal heart rate noted  Respiratory: Normal respiratory effort, no problems with respiration noted  Abdomen: Soft, gravid, appropriate for gestational age.  Pain/Pressure: Present     Pelvic: Cervical exam performed in the presence of a chaperone Dilation: 3.5 Effacement (%): 50 Station: -2  Extremities: Normal range of motion.  Edema: None  Mental Status: Normal mood and affect. Normal behavior. Normal judgment and thought content.   Assessment and Plan:  Pregnancy: G2P1001 at [redacted]w[redacted]d 1. Supervision of other normal pregnancy, antepartum FHT and FH normal. Induction scheduled for 40 weeks per patient preference and favorable cervix.   Term labor symptoms and general obstetric precautions including but not limited to vaginal bleeding, contractions, leaking of fluid and fetal movement were reviewed in detail with the patient. Please refer to After Visit Summary for other counseling recommendations.   No  follow-ups on file.  Future Appointments  Date Time Provider Department Center  10/25/2021 10:55 AM Levie Heritage, DO [facility] None    Levie Heritage, DO

## 2021-10-17 NOTE — Progress Notes (Signed)
ROB 38.[redacted] wks GA No concerns today Pre admitted 10/27/21 Requests SVE

## 2021-10-17 NOTE — Telephone Encounter (Signed)
Preadmission screen  

## 2021-10-17 NOTE — Addendum Note (Signed)
Addended by: Levie Heritage on: 10/17/2021 12:06 PM   Modules accepted: Orders

## 2021-10-19 ENCOUNTER — Inpatient Hospital Stay (HOSPITAL_COMMUNITY)
Admission: AD | Admit: 2021-10-19 | Discharge: 2021-10-21 | DRG: 807 | Disposition: A | Discharge status: Home / Self Care | Payer: No Typology Code available for payment source | Attending: Obstetrics and Gynecology | Admitting: Obstetrics and Gynecology

## 2021-10-19 DIAGNOSIS — Z3480 Encounter for supervision of other normal pregnancy, unspecified trimester: Secondary | ICD-10-CM

## 2021-10-19 DIAGNOSIS — Z3A38 38 weeks gestation of pregnancy: Secondary | ICD-10-CM

## 2021-10-19 MED ORDER — LACTATED RINGERS IV SOLN: 500.0000 mL | INTRAVENOUS | Status: DC | PRN

## 2021-10-19 MED ORDER — LACTATED RINGERS IV SOLN
INTRAVENOUS | Status: DC
Start: 2021-10-20 — End: 2021-10-20

## 2021-10-19 NOTE — H&P (Signed)
OBSTETRIC ADMISSION HISTORY AND PHYSICAL  Caroline Mack is a 31 y.o. female G2P1001 with IUP at [redacted]w[redacted]d by LMP presenting for SOL. She reports +FMs, no LOF, no VB, no blurry vision, headaches, peripheral edema, or RUQ pain.  She plans on breast and bottle feeding. She is planning for partner vasectomy for birth control postpartum.   She received her prenatal care at [facility].   Dating: By LMP --->  Estimated Date of Delivery: 10/27/21  Sono:   @[redacted]w[redacted]d , CWD, normal anatomy, cephalic presentation, posterior placental lie, 386 g, 94% EFW  Prenatal History/Complications: None   Past Medical History: Past Medical History:  Diagnosis Date   Medical history non-contributory     Past Surgical History: Past Surgical History:  Procedure Laterality Date   ear tumor Left     Obstetrical History: OB History     Gravida  2   Para  1   Term  1   Preterm      AB      Living  1      SAB      IAB      Ectopic      Multiple  0   Live Births  1           Social History Social History   Socioeconomic History   Marital status: Married    Spouse name:   Number of children: 1   Years of education: Not on file   Highest education level: Not on file  Occupational History   Not on file  Tobacco Use   Smoking status: Never   Smokeless tobacco: Never  Vaping Use   Vaping Use: Never used  Substance and Sexual Activity   Alcohol use: Never   Drug use: Never   Sexual activity: Yes  Other Topics Concern   Not on file  Social History Narrative   Not on file   Social Determinants of Health   Financial Resource Strain: Not on file  Food Insecurity: No Food Insecurity (09/29/2018)   Hunger Vital Sign    Worried About Running Out of Food in the Last Year: Never true    Ran Out of Food in the Last Year: Never true  Transportation Needs: No Transportation Needs (09/29/2018)   PRAPARE - 11/29/2018 (Medical): No    Lack of Transportation  (Non-Medical): No  Physical Activity: Not on file  Stress: Not on file  Social Connections: Not on file    Family History: Family History  Problem Relation Age of Onset   Diabetes Maternal Grandfather    Cancer Paternal Grandfather    Hypertension Neg Hx     Allergies: No Known Allergies  Medications Prior to Admission  Medication Sig Dispense Refill Last Dose   Prenatal Vit-Fe Fumarate-FA (PRENATAL VITAMINS PO) Take by mouth.      vitamin B-12 (CYANOCOBALAMIN) 1000 MCG tablet Take 1,000 mcg by mouth daily. Pt is taking 1000 mcg 3 times a week        Review of Systems  All systems reviewed and negative except as stated in HPI  Blood pressure 122/76, pulse 90, temperature 98 F (36.7 C), temperature source Oral, resp. rate 18, height 5\' 6"  (1.676 m), weight 71.6 kg, last menstrual period 01/20/2021, unknown if currently breastfeeding.  General appearance: alert, cooperative, and no distress Lungs: normal work of breathing on room air  Heart: normal rate, warm and well perfused  Abdomen: soft, non-tender, gravid  Extremities: no LE edema  Presentation: Cephalic by bedside US  Fetal monitoring: Baseline 130 bpm, moderate variability, + accels, no decels  Uterine activity: Every 2-4 minutes  Dilation: 7.5 Effacement (%): 90 Exam by:: Caprice Renshaw, RN  Prenatal labs: ABO, Rh: --/--/PENDING (06/30 2353) Antibody: PENDING (06/30 2353) Rubella: 1.50 (12/15 1055) RPR: Non Reactive (04/05 0837)  HBsAg: Negative (12/15 1055)  HIV: Non Reactive (04/05 0837)  GBS: Negative/-- (06/19 0831)  2 hr Glucola normal  Genetic screening - Declined  Anatomy US normal   Prenatal Transfer Tool  Maternal Diabetes: No Genetic Screening: Declined Maternal Ultrasounds/Referrals: Normal Fetal Ultrasounds or other Referrals:  None Maternal Substance Abuse:  No Significant Maternal Medications:  None Significant Maternal Lab Results: Group B Strep negative  Results for orders placed  or performed during the hospital encounter of 10/19/21 (from the past 24 hour(s))  CBC   Collection Time: 10/19/21 11:53 PM  Result Value Ref Range   WBC 11.9 (H) 4.0 - 10.5 K/uL   RBC 3.95 3.87 - 5.11 MIL/uL   Hemoglobin 12.1 12.0 - 15.0 g/dL   HCT 96.7 59.1 - 63.8 %   MCV 93.4 80.0 - 100.0 fL   MCH 30.6 26.0 - 34.0 pg   MCHC 32.8 30.0 - 36.0 g/dL   RDW 46.6 59.9 - 35.7 %   Platelets 233 150 - 400 K/uL   nRBC 0.0 0.0 - 0.2 %  Type and screen [facility]   Collection Time: 10/19/21 11:53 PM  Result Value Ref Range   ABO/RH(D) PENDING    Antibody Screen PENDING    Sample Expiration      10/22/2021,2359 Performed at [facility], [address], [place], [place] [address]     Patient Active Problem List   Diagnosis Date Noted   Normal labor 10/20/2021   Supervision of other normal pregnancy, antepartum 04/05/2021   Assessment/Plan:  Caroline Mack is a 31 y.o. G2P1001 at [redacted]w[redacted]d here for SOL.   #Labor: Expectant management for now. AROM as desired. Anticipate SVD.  #Pain: Requesting epidural. Anesthesia notified.  #FWB: Cat 1  #ID:  GBS neg #MOF: Breast  #MOC: Partner vasectomy  #Circ:  Desires   Worthy Rancher, MD  10/20/2021, 12:18 AM

## 2021-10-19 NOTE — MAU Note (Signed)
Pt says UC's strong since 10pm P[facility] with [facility]- VE - Wed- 4 cm  Lost mucus plug - today Denies HSV GBS_ neg

## 2021-10-19 NOTE — H&P (Incomplete)
OBSTETRIC ADMISSION HISTORY AND PHYSICAL  Caroline Mack is a 31 y.o. female G2P1001 with IUP at [redacted]w[redacted]d by LMP presenting for SOL. She reports +FMs, no LOF, no VB, no blurry vision, headaches, peripheral edema, or RUQ pain.  She plans on breast feeding. She is planning for partner vasectomy for birth control postpartum.   She received her prenatal care at [facility].   Dating: By LMP --->  Estimated Date of Delivery: 10/27/21  Sono:   @[redacted]w[redacted]d , CWD, normal anatomy, cephalic presentation, posterior placental lie, 386 g, 94% EFW  Prenatal History/Complications: None   Past Medical History: Past Medical History:  Diagnosis Date  . Medical history non-contributory     Past Surgical History: Past Surgical History:  Procedure Laterality Date  . ear tumor Left     Obstetrical History: OB History     Gravida  2   Para  1   Term  1   Preterm      AB      Living  1      SAB      IAB      Ectopic      Multiple  0   Live Births  1           Social History Social History   Socioeconomic History  . Marital status: Married    Spouse name: Not on file  . Number of children: Not on file  . Years of education: Not on file  . Highest education level: Not on file  Occupational History  . Not on file  Tobacco Use  . Smoking status: Never  . Smokeless tobacco: Never  Vaping Use  . Vaping Use: Never used  Substance and Sexual Activity  . Alcohol use: Never  . Drug use: Never  . Sexual activity: Yes  Other Topics Concern  . Not on file  Social History Narrative  . Not on file   Social Determinants of Health   Financial Resource Strain: Not on file  Food Insecurity: No Food Insecurity (09/29/2018)   Hunger Vital Sign   . Worried About 11/29/2018 in the Last Year: Never true   . Ran Out of Food in the Last Year: Never true  Transportation Needs: No Transportation Needs (09/29/2018)   PRAPARE - Transportation   . Lack of Transportation (Medical): No   .  Lack of Transportation (Non-Medical): No  Physical Activity: Not on file  Stress: Not on file  Social Connections: Not on file    Family History: Family History  Problem Relation Age of Onset  . Diabetes Maternal Grandfather   . Cancer Paternal Grandfather   . Hypertension Neg Hx     Allergies: No Known Allergies  Medications Prior to Admission  Medication Sig Dispense Refill Last Dose  . Prenatal Vit-Fe Fumarate-FA (PRENATAL VITAMINS PO) Take by mouth.     . vitamin B-12 (CYANOCOBALAMIN) 1000 MCG tablet Take 1,000 mcg by mouth daily. Pt is taking 1000 mcg 3 times a week        Review of Systems  All systems reviewed and negative except as stated in HPI  Blood pressure 127/78, pulse 80, temperature 97.7 F (36.5 C), temperature source Oral, resp. rate 20, height 5\' 6"  (1.676 m), weight 71.6 kg, last menstrual period 01/20/2021, unknown if currently breastfeeding.  General appearance: alert, cooperative, and no distress Lungs: normal work of breathing on room air  Heart: normal rate, warm and well perfused  Abdomen: soft,  non-tender, gravid  Extremities: no LE edema or calf tenderness to palpation   Presentation: Cephalic by bedside US  Fetal monitoring: Baseline ***, moderate variability, *** accels, *** decels  Uterine activity: Every *** minutes  Dilation: 7.5 Effacement (%): 90 Exam by:: Caprice Renshaw, RN  Prenatal labs: ABO, Rh: A/Positive/-- (12/15 1055) Antibody: Negative (12/15 1055) Rubella: 1.50 (12/15 1055) RPR: Non Reactive (04/05 0837)  HBsAg: Negative (12/15 1055)  HIV: Non Reactive (04/05 0837)  GBS: Negative/-- (06/19 0831)  2 hr Glucola normal  Genetic screening - Declined  Anatomy US normal   Prenatal Transfer Tool  Maternal Diabetes: {Maternal Diabetes:3043596} Genetic Screening: {Genetic Screening:20205} Maternal Ultrasounds/Referrals: {Maternal Ultrasounds / Referrals:20211} Fetal Ultrasounds or other Referrals:  {Fetal Ultrasounds or  Other Referrals:20213} Maternal Substance Abuse:  {Maternal Substance Abuse:20223} Significant Maternal Medications:  {Significant Maternal Meds:20233} Significant Maternal Lab Results: {Significant Maternal Lab Results:20235}  No results found for this or any previous visit (from the past 24 hour(s)).  Patient Active Problem List   Diagnosis Date Noted  . Supervision of other normal pregnancy, antepartum 04/05/2021    Assessment/Plan:  KRITIKA STUKES is a 31 y.o. G2P1001 at [redacted]w[redacted]d here for***  #Labor:*** #Pain: *** #FWB: *** #ID:  *** #MOF: *** #MOC:*** #Circ:  ***  Worthy Rancher, MD  10/19/2021, 11:51 PM

## 2021-10-19 NOTE — MAU Provider Note (Signed)
[id number] Caroline Mack 1990/07/27  Patient informed that the ultrasound is considered a limited OB ultrasound and is not intended to be a complete ultrasound exam.  Patient also informed that the ultrasound is not being completed with the intent of assessing for fetal or placental anomalies or any pelvic abnormalities.  Explained that the purpose of today's ultrasound is to assess for  {Blank multiple:19196::"***","BPP","presentation","AFI","viability"}.  Patient acknowledges the purpose of the exam and the limitations of the study.  Cherre Robins, CNM 10/19/2021, 11:53 PM

## 2021-10-20 ENCOUNTER — Inpatient Hospital Stay (HOSPITAL_COMMUNITY): Payer: No Typology Code available for payment source | Admitting: Anesthesiology

## 2021-10-20 ENCOUNTER — Other Ambulatory Visit: Payer: Self-pay

## 2021-10-20 ENCOUNTER — Encounter (HOSPITAL_COMMUNITY): Payer: Self-pay | Admitting: Obstetrics and Gynecology

## 2021-10-20 DIAGNOSIS — Z3A38 38 weeks gestation of pregnancy: Secondary | ICD-10-CM

## 2021-10-20 DIAGNOSIS — O4202 Full-term premature rupture of membranes, onset of labor within 24 hours of rupture: Secondary | ICD-10-CM | POA: Diagnosis not present

## 2021-10-20 DIAGNOSIS — O26893 Other specified pregnancy related conditions, third trimester: Secondary | ICD-10-CM | POA: Diagnosis present

## 2021-10-20 LAB — RPR: RPR Ser Ql: NONREACTIVE

## 2021-10-20 LAB — CBC
HCT: 36.9 % (ref 36.0–46.0)
Hemoglobin: 12.1 g/dL (ref 12.0–15.0)
MCH: 30.6 pg (ref 26.0–34.0)
MCHC: 32.8 g/dL (ref 30.0–36.0)
MCV: 93.4 fL (ref 80.0–100.0)
Platelets: 233 10*3/uL (ref 150–400)
RBC: 3.95 MIL/uL (ref 3.87–5.11)
RDW: 13 % (ref 11.5–15.5)
WBC: 11.9 10*3/uL — ABNORMAL HIGH (ref 4.0–10.5)
nRBC: 0 % (ref 0.0–0.2)

## 2021-10-20 LAB — TYPE AND SCREEN
ABO/RH(D): A POS
Antibody Screen: NEGATIVE

## 2021-10-20 MED ORDER — ONDANSETRON HCL 4 MG/2ML IJ SOLN: 4.0000 mg | INTRAMUSCULAR | Status: DC | PRN

## 2021-10-20 MED ORDER — DIPHENHYDRAMINE HCL 25 MG PO CAPS: 25.0000 mg | ORAL_CAPSULE | Freq: Four times a day (QID) | ORAL | Status: DC | PRN

## 2021-10-20 MED ORDER — OXYCODONE-ACETAMINOPHEN 5-325 MG PO TABS: 2.0000 | ORAL_TABLET | ORAL | Status: DC | PRN

## 2021-10-20 MED ORDER — ONDANSETRON HCL 4 MG/2ML IJ SOLN: 4.0000 mg | Freq: Four times a day (QID) | INTRAMUSCULAR | Status: DC | PRN

## 2021-10-20 MED ORDER — FENTANYL CITRATE (PF) 100 MCG/2ML IJ SOLN: 50.0000 ug | INTRAMUSCULAR | Status: DC | PRN

## 2021-10-20 MED ORDER — DIPHENHYDRAMINE HCL 50 MG/ML IJ SOLN: 12.5000 mg | INTRAMUSCULAR | Status: DC | PRN

## 2021-10-20 MED ORDER — ACETAMINOPHEN 325 MG PO TABS: 650.0000 mg | ORAL_TABLET | ORAL | Status: DC | PRN

## 2021-10-20 MED ORDER — SOD CITRATE-CITRIC ACID 500-334 MG/5ML PO SOLN: 30.0000 mL | ORAL | Status: DC | PRN

## 2021-10-20 MED ORDER — BENZOCAINE-MENTHOL 20-0.5 % EX AERO
1.0000 | INHALATION_SPRAY | CUTANEOUS | Status: DC | PRN
  Administered 2021-10-20: 1 via TOPICAL
  Filled 2021-10-20: qty 56

## 2021-10-20 MED ORDER — COCONUT OIL OIL
1.0000 | TOPICAL_OIL | Status: DC | PRN
  Administered 2021-10-20: 1 via TOPICAL

## 2021-10-20 MED ORDER — LIDOCAINE HCL (PF) 1 % IJ SOLN: 30.0000 mL | INTRAMUSCULAR | Status: DC | PRN

## 2021-10-20 MED ORDER — PRENATAL MULTIVITAMIN CH
1.0000 | ORAL_TABLET | Freq: Every day | ORAL | Status: DC
  Administered 2021-10-20 – 2021-10-21 (×2): 1 via ORAL
  Filled 2021-10-20 (×2): qty 1

## 2021-10-20 MED ORDER — OXYTOCIN-SODIUM CHLORIDE 30-0.9 UT/500ML-% IV SOLN
2.5000 [IU]/h | INTRAVENOUS | Status: DC
  Administered 2021-10-20: 2.5 [IU]/h via INTRAVENOUS
  Filled 2021-10-20: qty 500

## 2021-10-20 MED ORDER — ONDANSETRON HCL 4 MG PO TABS: 4.0000 mg | ORAL_TABLET | ORAL | Status: DC | PRN

## 2021-10-20 MED ORDER — PHENYLEPHRINE 80 MCG/ML (10ML) SYRINGE FOR IV PUSH (FOR BLOOD PRESSURE SUPPORT): 80.0000 ug | PREFILLED_SYRINGE | INTRAVENOUS | Status: DC | PRN

## 2021-10-20 MED ORDER — EPHEDRINE 5 MG/ML INJ: 10.0000 mg | INTRAVENOUS | Status: DC | PRN

## 2021-10-20 MED ORDER — OXYTOCIN BOLUS FROM INFUSION
333.0000 mL | Freq: Once | INTRAVENOUS | Status: AC
  Administered 2021-10-20: 333 mL via INTRAVENOUS

## 2021-10-20 MED ORDER — LACTATED RINGERS IV SOLN
500.0000 mL | Freq: Once | INTRAVENOUS | Status: AC
  Administered 2021-10-20: 500 mL via INTRAVENOUS

## 2021-10-20 MED ORDER — LIDOCAINE HCL (PF) 1 % IJ SOLN
INTRAMUSCULAR | Status: DC | PRN
  Administered 2021-10-20: 6 mL via EPIDURAL
  Administered 2021-10-20: 4 mL via EPIDURAL

## 2021-10-20 MED ORDER — OXYCODONE HCL 5 MG PO TABS: 5.0000 mg | ORAL_TABLET | ORAL | Status: DC | PRN

## 2021-10-20 MED ORDER — FENTANYL-BUPIVACAINE-NACL 0.5-0.125-0.9 MG/250ML-% EP SOLN
12.0000 mL/h | EPIDURAL | Status: DC | PRN
  Administered 2021-10-20: 12 mL/h via EPIDURAL
  Filled 2021-10-20: qty 250

## 2021-10-20 MED ORDER — WITCH HAZEL-GLYCERIN EX PADS: 1.0000 | MEDICATED_PAD | CUTANEOUS | Status: DC | PRN

## 2021-10-20 MED ORDER — OXYCODONE HCL 5 MG PO TABS: 10.0000 mg | ORAL_TABLET | ORAL | Status: DC | PRN

## 2021-10-20 MED ORDER — SIMETHICONE 80 MG PO CHEW: 80.0000 mg | CHEWABLE_TABLET | ORAL | Status: DC | PRN

## 2021-10-20 MED ORDER — OXYCODONE-ACETAMINOPHEN 5-325 MG PO TABS: 1.0000 | ORAL_TABLET | ORAL | Status: DC | PRN

## 2021-10-20 MED ORDER — SENNOSIDES-DOCUSATE SODIUM 8.6-50 MG PO TABS
2.0000 | ORAL_TABLET | Freq: Every day | ORAL | Status: DC
  Administered 2021-10-21: 2 via ORAL
  Filled 2021-10-20: qty 2

## 2021-10-20 MED ORDER — DIBUCAINE (PERIANAL) 1 % EX OINT: 1.0000 | TOPICAL_OINTMENT | CUTANEOUS | Status: DC | PRN

## 2021-10-20 MED ORDER — IBUPROFEN 600 MG PO TABS
600.0000 mg | ORAL_TABLET | Freq: Four times a day (QID) | ORAL | Status: DC
  Administered 2021-10-20 – 2021-10-21 (×6): 600 mg via ORAL
  Filled 2021-10-20 (×6): qty 1

## 2021-10-20 NOTE — Lactation Note (Signed)
This note was copied from a baby's chart. Lactation Consultation Note  Patient Name: Caroline Mack CWCBJ'S Date: 10/20/2021 Reason for consult: Initial assessment;Term Age:33 hours  LC in to visit with P2 Mom of term baby.  Baby sleeping STS on Mom's chest.  Mom reports that baby has latched twice so far.  Mom denies any pain with latching.    Mom pumped exclusively for 6 months with first baby (now 2 1/2) because she wouldn't latch.    Reviewed basics and encouraged continued STS, watching for feeding cues and offering the breast often.    Mom aware of lactation support and encouraged to ask for help prn.   Maternal Data Has patient been taught Hand Expression?: No Does the patient have breastfeeding experience prior to this delivery?: Yes How long did the patient breastfeed?: 6 months  Feeding Mother's Current Feeding Choice: Breast Milk and Formula  Interventions Interventions: Breast feeding basics reviewed;Skin to skin;Breast massage;Hand express;LC Services brochure  Consult Status Consult Status: Follow-up Date: 10/21/21 Follow-up type: In-patient    Judee Clara 10/20/2021, 8:41 AM

## 2021-10-20 NOTE — Progress Notes (Signed)
Labor Progress Note Caroline Mack is a 31 y.o. G2P1001 at [redacted]w[redacted]d who presented for SOL.  S: Doing well per RN. Water just broke, clear. Feeling some pressure here and there but nothing constant.   O:  BP 101/66   Pulse 98   Temp 98 F (36.7 C) (Oral)   Resp 17   Ht 5\' 6"  (1.676 m)   Wt 71.6 kg   LMP 01/20/2021   SpO2 99%   BMI 25.47 kg/m   EFM: Baseline 125 bpm, moderate variability, + accels, occasional variable decel Toco: Every 1-3 minutes   CVE: Dilation: 7.5 Effacement (%): 90 Exam by:: [email or web address], RN  A&P: 31 y.o. G2P1001 [redacted]w[redacted]d   #Labor: Progressing well. SROM with clear fluid. Feeling intermittent pressure. Will reassess in 2 hours, sooner if she feels constant pressure or urge to push.  #Pain: Epidural  #FWB: Cat 2 due to occasional variable decel. Reassuring variability and accels. Will continue to monitor.  #GBS negative  [redacted]w[redacted]d, MD 2:11 AM

## 2021-10-20 NOTE — Anesthesia Procedure Notes (Signed)
Epidural Patient location during procedure: OB Start time: 10/20/2021 12:22 AM End time: 10/20/2021 12:34 AM  Staffing Anesthesiologist: Lucretia Kern, MD Performed: anesthesiologist   Preanesthetic Checklist Completed: patient identified, IV checked, risks and benefits discussed, monitors and equipment checked, pre-op evaluation and timeout performed  Epidural Patient position: sitting Prep: DuraPrep Patient monitoring: heart rate, continuous pulse ox and blood pressure Approach: midline Location: L3-L4 Injection technique: LOR air  Needle:  Needle type: Tuohy  Needle gauge: 17 G Needle length: 9 cm Needle insertion depth: 5 cm Catheter type: closed end flexible Catheter size: 19 Gauge Catheter at skin depth: 10 cm Test dose: negative  Assessment Events: blood not aspirated, injection not painful, no injection resistance, no paresthesia and negative IV test  Additional Notes Reason for block:procedure for pain

## 2021-10-20 NOTE — Anesthesia Postprocedure Evaluation (Signed)
Anesthesia Post Note  Patient: Caroline Mack  Procedure(s) Performed: AN AD HOC LABOR EPIDURAL     Patient location during evaluation: Mother Baby Anesthesia Type: Epidural Level of consciousness: awake Pain management: satisfactory to patient Vital Signs Assessment: post-procedure vital signs reviewed and stable Respiratory status: spontaneous breathing Cardiovascular status: stable Anesthetic complications: no   No notable events documented.  Last Vitals:  Vitals:   10/20/21 0612 10/20/21 1016  BP: 106/73 104/73  Pulse: 74 85  Resp:  18  Temp: 36.6 C 36.8 C  SpO2:  99%    Last Pain:  Vitals:   10/20/21 1016  TempSrc: Oral  PainSc: 0-No pain   Pain Goal:                   [employer]

## 2021-10-20 NOTE — Discharge Summary (Signed)
Postpartum Discharge Summary  Date of Service updated***    Patient Name: Caroline Mack DOB: Mar 05, 1991 MRN: [id number]  Date of admission: 10/19/2021 Delivery date:10/20/2021  Delivering provider: Genia Del  Date of discharge: 10/20/2021  Admitting diagnosis: Normal labor [O80, Z37.9] Intrauterine pregnancy: [redacted]w[redacted]d    Secondary diagnosis:  Principal Problem:   Vaginal delivery Active Problems:   Supervision of other normal pregnancy, antepartum   Normal labor  Additional problems: ***    Discharge diagnosis: Term Pregnancy Delivered                                              Post partum procedures: {Postpartum procedures:23558} Augmentation:  None Complications: None  Hospital course: Onset of Labor With Vaginal Delivery      31y.o. yo G2P1001 at 330w0das admitted in Active Labor on 10/19/2021. Patient had an uncomplicated labor course as follows:  Membrane Rupture Time/Date: 1:58 AM ,10/20/2021   Delivery Method:Vaginal, Spontaneous  Episiotomy: None  Lacerations:  Labial  Patient had an uncomplicated postpartum course.  She is ambulating, tolerating a regular diet, passing flatus, and urinating well.  Her pain and bleeding are controlled.  She is breastfeeding*** well.  Patient is discharged home in stable condition on 10/20/21.  Newborn Data: Birth date:10/20/2021  Birth time:3:16 AM  Gender:Female  Living status:Living  Apgars:9 ,9  Weight:   Magnesium Sulfate received: No BMZ received: No Rhophylac: N/A MMR: N/A - Immune  T-DaP: Given prenatally Flu: No Transfusion: No  Physical exam  Vitals:   10/20/21 0306 10/20/21 0311 10/20/21 0327 10/20/21 0331  BP:   107/65 111/68  Pulse:   87 91  Resp:      Temp:      TempSrc:      SpO2: 100% 99%    Weight:      Height:       General: {Exam; general:21111117} Lochia: {Desc; appropriate/inappropriate:30686::"appropriate"} Uterine Fundus: {Desc; firm/soft:30687} Incision: {Exam;  incision:21111123} DVT Evaluation: {Exam; dvt:2111122}  Labs: Lab Results  Component Value Date   WBC 11.9 (H) 10/19/2021   HGB 12.1 10/19/2021   HCT 36.9 10/19/2021   MCV 93.4 10/19/2021   PLT 233 10/19/2021      Latest Ref Rng & Units 04/12/2020    8:16 AM  CMP  Glucose 70 - 99 mg/dL 101   BUN 6 - 23 mg/dL 19   Creatinine 0.40 - 1.20 mg/dL 0.88   Sodium 135 - 145 mEq/L 138   Potassium 3.5 - 5.1 mEq/L 3.9   Chloride 96 - 112 mEq/L 104   CO2 19 - 32 mEq/L 28   Calcium 8.4 - 10.5 mg/dL 9.6   Total Protein 6.0 - 8.3 g/dL 7.6   Total Bilirubin 0.2 - 1.2 mg/dL 0.7   Alkaline Phos 39 - 117 U/L 54   AST 0 - 37 U/L 16   ALT 0 - 35 U/L 14     Edinburgh Score:    06/30/2019    4:05 PM  Edinburgh Postnatal Depression Scale Screening Tool  I have been able to laugh and see the funny side of things. 0  I have looked forward with enjoyment to things. 0  I have blamed myself unnecessarily when things went wrong. 1  I have been anxious or worried for no good reason. 1  I have felt scared or  panicky for no good reason. 1  Things have been getting on top of me. 0  I have been so unhappy that I have had difficulty sleeping. 0  I have felt sad or miserable. 0  I have been so unhappy that I have been crying. 1  The thought of harming myself has occurred to me. 0  Edinburgh Postnatal Depression Scale Total 4    After visit meds:  Allergies as of 10/20/2021   No Known Allergies   Med Rec must be completed prior to using this [facility]***       Discharge home in stable condition Infant Feeding: Breast Infant Disposition: {CHL IP OB HOME WITH VICFPF:78776} Discharge instruction: per After Visit Summary and Postpartum booklet. Activity: Advance as tolerated. Pelvic rest for 6 weeks.  Diet: routine diet Future Appointments: Future Appointments  Date Time Provider [place]  10/25/2021 10:55 AM Truett Mainland, DO [facility] None  10/27/2021  7:00 AM [facility] [place]  [facility] None   Follow up Visit: Message sent to [facility] by Dr. Gwenlyn Perking on 10/20/21.   Please schedule this patient for a in-person postpartum visit in 6 weeks with the following provider: Any provider. Additional Postpartum F/U:  None   Low risk pregnancy complicated by:  None Delivery mode:  Vaginal, Spontaneous  Anticipated Birth Control:  Partner vasectomy  10/20/2021 Genia Del, MD

## 2021-10-20 NOTE — MAU Note (Signed)
Pt arrived to MAU in active labor. Pt is 7.5/90. Expedited transfer to l&d. FHR 130

## 2021-10-20 NOTE — Anesthesia Preprocedure Evaluation (Signed)

## 2021-10-20 NOTE — Lactation Note (Signed)
This note was copied from a baby's chart. Lactation Consultation Note Mom has been unable to latch baby. It is to painful. Baby is biting. Mom has everted nipples. Breast tissue w/edema. [place] and nipple tissue feels thick. Reverse pressure not helpful. Shells given to wear in am. Fitted #24 NS. Baby bites, held in mouth and wouldn't suck. Tongue thrust. Suck training w/gloved finger, baby chews, bites, and tongue thrust on gloved finger.  Mom doesn't want the baby to be hungry. Baby is cueing and rooting but will not latch. Mom would like to give formula until her milk supply comes in. Mom encouraged to feed baby 8-12 times/24 hours and with feeding cues.  Mom stated this is the same thing that happened w/her 2 1/33 yr old. It was a very stressful time. They tried everything and mom ended up pumping and bottle feeding for 6 months. Praised mom for pumping that long and returning to work. Discussed pumping mom in agreement.  Mom shown how to use DEBP & how to disassemble, clean, & reassemble parts. Mom knows to pump q3h for 15-20 min.  Mom pumped w.#27 flanges. Nothing collected at this time.  LC gave baby formula. Baby chewed and tongue thrusted nipple and finally started suckling.  When baby cried noted tongue not raising to roof of mouth.  Mom held baby upright after taking formula. Gave mom formula feeding amount according to hours of age.  Mom is [employer] employee gave mom choices of pumps to choose from. Mom will let Lactation know what she decides. Encouraged to call for questions or concerns. Mom wants to go home tomorrow.  Patient Name: Caroline Mack JOINO'M Date: 10/20/2021 Reason for consult: Term;Mother's request Age:85 hours  Maternal Data Has patient been taught Hand Expression?: Yes Does the patient have breastfeeding experience prior to this delivery?: Yes How long did the patient breastfeed?: 6 months  Feeding Nipple Type: Slow - flow  LATCH Score Latch: Too sleepy  or reluctant, no latch achieved, no sucking elicited.  Audible Swallowing: None  Type of Nipple: Everted at rest and after stimulation  Comfort (Breast/Nipple): Filling, red/small blisters or bruises, mild/mod discomfort (edema)  Hold (Positioning): Full assist, staff holds infant at breast  LATCH Score: 3   Lactation Tools Discussed/Used Tools: Shells;Pump;Coconut oil;Flanges;Nipple Shields Nipple shield size: 24 Flange Size: 27 Breast pump type: Double-Electric Breast Pump Pump Education: Setup, frequency, and cleaning;Milk Storage Reason for Pumping: to painful for latching at this time Pumping frequency: q3hr  Interventions Interventions: Breast feeding basics reviewed;Assisted with latch;Skin to skin;Breast massage;Hand express;Pre-pump if needed;Reverse pressure;Breast compression;Adjust position;Support pillows;Position options;Coconut oil;Shells;DEBP;Pace feeding  Discharge    Consult Status Consult Status: Follow-up Date: 10/21/21 Follow-up type: In-patient    Charyl Dancer 10/20/2021, 9:01 PM

## 2021-10-20 NOTE — Plan of Care (Signed)
  Problem: Education: Goal: Knowledge of condition will improve Outcome: Completed/Met

## 2021-10-21 MED ORDER — ACETAMINOPHEN 500 MG PO TABS: 1000.0000 mg | ORAL_TABLET | Freq: Three times a day (TID) | ORAL | Status: DC | PRN

## 2021-10-21 MED ORDER — IBUPROFEN 200 MG PO TABS: 600.0000 mg | ORAL_TABLET | Freq: Four times a day (QID) | ORAL | Status: DC | PRN

## 2021-10-21 NOTE — Lactation Note (Signed)
This note was copied from a baby's chart. Lactation Consultation Note  Patient Name: Caroline Mack AYTKZ'S Date: 10/21/2021 Reason for consult: Follow-up assessment;Difficult latch;Term;Nipple pain/trauma Age:33 hours  LC in to visit with P2 Mom of term baby on day of discharge.  Baby is at 4% weight loss.  Due to painful latching, Mom has been choosing to supplement baby with formula.  Mom said she tried a nipple shield but declines trying again.  Spoke with Mom about the benefit of an OP lactation appt.  Encouraged her to support a good milk supply with consistent pumping and then offered to message OP to set up an appt.  Mom would rather call herself.  She didn't have any success breastfeeding her first baby, and now she is having trouble with her 2nd.  Praised our OP Advertising copywriter and reassured Mom that if baby has an oral restriction, S. Hice RN IBCLC would be the best resource to assess this.    Engorgement prevention and treatment reviewed. Mom aware of OP lactation support, phone line, support groups etc. And encouraged her to call prn.  Mom denies any questions.   Lactation Tools Discussed/Used Tools: Pump;Flanges;Bottle;Shells;Nipple Shields Nipple shield size: 24 Flange Size: 27 Breast pump type: Double-Electric Breast Pump Reason for Pumping: Support milk supply  Interventions Interventions: Breast feeding basics reviewed;Breast massage;Hand express;Skin to skin;DEBP;Education;Pace feeding  Discharge Discharge Education: Engorgement and breast care;Warning signs for feeding baby;Outpatient recommendation Pump: Employee Pump (Mom chose the [place])  Consult Status Consult Status: Complete Date: 10/21/21 Follow-up type: Call as needed    Caroline Mack 10/21/2021, 1:06 PM

## 2021-10-21 NOTE — Plan of Care (Signed)
  Problem: Activity: Goal: Will verbalize the importance of balancing activity with adequate rest periods Outcome: Completed/Met Goal: Ability to tolerate increased activity will improve Outcome: Completed/Met   Problem: Coping: Goal: Ability to identify and utilize available resources and services will improve Outcome: Completed/Met   Problem: Life Cycle: Goal: Chance of risk for complications during the postpartum period will decrease Outcome: Completed/Met   Problem: Role Relationship: Goal: Ability to demonstrate positive interaction with newborn will improve Outcome: Completed/Met   Problem: Skin Integrity: Goal: Demonstration of wound healing without infection will improve Outcome: Completed/Met

## 2021-10-25 ENCOUNTER — Encounter: Payer: No Typology Code available for payment source | Admitting: Family Medicine

## 2021-10-27 ENCOUNTER — Inpatient Hospital Stay (HOSPITAL_COMMUNITY): Payer: No Typology Code available for payment source

## 2021-10-27 ENCOUNTER — Inpatient Hospital Stay (HOSPITAL_COMMUNITY)
Admission: AD | Admit: 2021-10-27 | Payer: No Typology Code available for payment source | Source: Home / Self Care | Admitting: Family Medicine

## 2021-10-30 ENCOUNTER — Telehealth (HOSPITAL_COMMUNITY): Payer: Self-pay | Admitting: *Deleted

## 2021-10-30 NOTE — Telephone Encounter (Signed)
Mom reports feeling good. No concerns about herself at this time. EPDS=1 [facility] score=0) Mom reports baby is doing well. Feeding, peeing, and pooping without difficulty. Safe sleep reviewed. Mom reports no concerns about baby at present.  Duffy Rhody, RN 10-30-2021 at 2:01pm

## 2021-11-29 ENCOUNTER — Ambulatory Visit (INDEPENDENT_AMBULATORY_CARE_PROVIDER_SITE_OTHER): Payer: No Typology Code available for payment source | Admitting: Family Medicine

## 2021-11-29 ENCOUNTER — Encounter: Payer: Self-pay | Admitting: Family Medicine

## 2021-11-29 NOTE — Progress Notes (Signed)
Post Partum Visit Note  Caroline Mack is a 31 y.o. [id number] female who presents for a postpartum visit. She is 5 weeks postpartum following a normal spontaneous vaginal delivery.  I have fully reviewed the prenatal and intrapartum course. The delivery was at 39 gestational weeks.  Anesthesia: epidural. Postpartum course has been normal. Baby is doing well. Baby is feeding by breast. Bleeding no bleeding. Bowel function is normal. Bladder function is normal. Patient is not sexually active. Contraception method is none. Postpartum depression screening: negative.   The pregnancy intention screening data noted above was reviewed. Potential methods of contraception were discussed. The patient elected to proceed with No data recorded.   Edinburgh Postnatal Depression Scale - 11/29/21 1329       Edinburgh Postnatal Depression Scale:  In the Past 7 Days   I have been able to laugh and see the funny side of things. 0    I have looked forward with enjoyment to things. 0    I have blamed myself unnecessarily when things went wrong. 0    I have been anxious or worried for no good reason. 0    I have felt scared or panicky for no good reason. 0    Things have been getting on top of me. 0    I have been so unhappy that I have had difficulty sleeping. 0    I have felt sad or miserable. 0    I have been so unhappy that I have been crying. 0    The thought of harming myself has occurred to me. 0    Edinburgh Postnatal Depression Scale Total 0             Health Maintenance Due  Topic Date Due   COVID-19 Vaccine (3 - Pfizer series) 03/08/2020   INFLUENZA VACCINE  11/20/2021    The following portions of the patient's history were reviewed and updated as appropriate: allergies, current medications, past family history, past medical history, past social history, past surgical history, and problem list.  Review of Systems Pertinent items are noted in HPI.  Objective:  BP (!) 94/57   Pulse  64   Wt 135 lb (61.2 kg)   LMP 01/20/2021   Breastfeeding Yes   BMI 21.79 kg/m    General:  alert, cooperative, and no distress   Breasts:  not indicated  Lungs: clear to auscultation bilaterally  Heart:  regular rate and rhythm, S1, S2 normal, no murmur, click, rub or gallop  Abdomen: soft, non-tender; bowel sounds normal; no masses,  no organomegaly   Wound N/a  GU exam:  not indicated       Assessment:   1. Postpartum care and examination   Plan:   Essential components of care per ACOG recommendations:  1.  Mood and well being: Patient with negative depression screening today. Reviewed local resources for support.  - Patient tobacco use? No.   - hx of drug use? No.    2. Infant care and feeding:  -Patient currently breastmilk feeding? Yes. Reviewed importance of draining breast regularly to support lactation.  -Social determinants of health (SDOH) reviewed in EPIC. No concerns  3. Sexuality, contraception and birth spacing - Patient does not want a pregnancy in the next year.  - Reviewed reproductive life planning. Reviewed contraceptive methods based on pt preferences and effectiveness.  Patient desired Female Condom today.   - Discussed birth spacing of 18 months  4. Sleep and fatigue -Encouraged family/partner/community  support of 4 hrs of uninterrupted sleep to help with mood and fatigue  5. Physical Recovery  - Discussed patients delivery and complications. She describes her labor as good. - Patient had a Vaginal, no problems at delivery. Patient had a  labial  laceration. Perineal healing reviewed. Patient expressed understanding - Patient has urinary incontinence? No. - Patient is safe to resume physical and sexual activity  6.  Health Maintenance - HM due items addressed Yes - Last pap smear  Diagnosis  Date Value Ref Range Status  04/05/2021   Final   - Negative for intraepithelial lesion or malignancy (NILM)   Pap smear not done at today's visit.   -Breast Cancer screening indicated? No.   7. Chronic Disease/Pregnancy Condition follow up: None  - PCP follow up  Levie Heritage, DO [facility], [facility]
